# Patient Record
Sex: Female | Born: 1990 | Race: Black or African American | Hispanic: No | Marital: Single | Smoking: Former smoker
Health system: Southern US, Community
[De-identification: ages and names within clinical notes are randomized; demographics above are authoritative.]

## PROBLEM LIST (undated history)

## (undated) DIAGNOSIS — D649 Anemia, unspecified: Secondary | ICD-10-CM

## (undated) DIAGNOSIS — R011 Cardiac murmur, unspecified: Secondary | ICD-10-CM

## (undated) DIAGNOSIS — N760 Acute vaginitis: Secondary | ICD-10-CM

## (undated) DIAGNOSIS — B9689 Other specified bacterial agents as the cause of diseases classified elsewhere: Secondary | ICD-10-CM

## (undated) DIAGNOSIS — S32401A Unspecified fracture of right acetabulum, initial encounter for closed fracture: Secondary | ICD-10-CM

## (undated) HISTORY — PX: FRACTURE SURGERY: SHX138

---
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
## 2010-03-24 ENCOUNTER — Encounter: Payer: Self-pay | Admitting: *Deleted

## 2010-05-10 LAB — ABO/RH: ABO/RH(D): O POS

## 2010-05-10 LAB — CBC
Hemoglobin: 8.6 g/dL — ABNORMAL LOW (ref 12.0–15.0)
MCHC: 32.4 g/dL (ref 30.0–36.0)
MCV: 70.5 fL — ABNORMAL LOW (ref 78.0–100.0)
Platelets: 231 10*3/uL (ref 150–400)
WBC: 4.7 10*3/uL (ref 4.0–10.5)

## 2010-05-10 LAB — WET PREP, GENITAL
Clue Cells Wet Prep HPF POC: NONE SEEN
Trich, Wet Prep: NONE SEEN
Yeast Wet Prep HPF POC: NONE SEEN

## 2010-05-10 LAB — URINALYSIS, ROUTINE W REFLEX MICROSCOPIC
Glucose, UA: NEGATIVE mg/dL
Leukocytes, UA: NEGATIVE
Nitrite: NEGATIVE
Protein, ur: NEGATIVE mg/dL
Urobilinogen, UA: 0.2 mg/dL (ref 0.0–1.0)
pH: 6 (ref 5.0–8.0)

## 2010-05-10 LAB — HCG, QUANTITATIVE, PREGNANCY
hCG, Beta Chain, Quant, S: 2052 m[IU]/mL — ABNORMAL HIGH (ref <5)
hCG, Beta Chain, Quant, S: 5482 m[IU]/mL — ABNORMAL HIGH (ref <5)

## 2010-05-10 LAB — GC/CHLAMYDIA PROBE AMP, GENITAL
Chlamydia, DNA Probe: NEGATIVE
GC Probe Amp, Genital: NEGATIVE

## 2016-10-02 ENCOUNTER — Emergency Department (HOSPITAL_COMMUNITY)
Admission: EM | Admit: 2016-10-02 | Discharge: 2016-10-02 | Disposition: A | Discharge status: Home / Self Care | Payer: Self-pay | Attending: Emergency Medicine | Admitting: Emergency Medicine

## 2016-10-02 ENCOUNTER — Encounter (HOSPITAL_COMMUNITY): Payer: Self-pay | Admitting: Emergency Medicine

## 2016-10-02 DIAGNOSIS — D649 Anemia, unspecified: Secondary | ICD-10-CM | POA: Insufficient documentation

## 2016-10-02 DIAGNOSIS — R1030 Lower abdominal pain, unspecified: Secondary | ICD-10-CM | POA: Insufficient documentation

## 2016-10-02 DIAGNOSIS — B3731 Acute candidiasis of vulva and vagina: Secondary | ICD-10-CM

## 2016-10-02 DIAGNOSIS — N760 Acute vaginitis: Secondary | ICD-10-CM | POA: Insufficient documentation

## 2016-10-02 DIAGNOSIS — B373 Candidiasis of vulva and vagina: Secondary | ICD-10-CM

## 2016-10-02 HISTORY — DX: Other specified bacterial agents as the cause of diseases classified elsewhere: N76.0

## 2016-10-02 HISTORY — DX: Acute vaginitis: B96.89

## 2016-10-02 LAB — COMPREHENSIVE METABOLIC PANEL
ALK PHOS: 48 U/L (ref 38–126)
ALT: 15 U/L (ref 14–54)
AST: 30 U/L (ref 15–41)
Albumin: 4.1 g/dL (ref 3.5–5.0)
Anion gap: 9 (ref 5–15)
BILIRUBIN TOTAL: 0.7 mg/dL (ref 0.3–1.2)
BUN: 12 mg/dL (ref 6–20)
CALCIUM: 9.4 mg/dL (ref 8.9–10.3)
CO2: 22 mmol/L (ref 22–32)
CREATININE: 0.72 mg/dL (ref 0.44–1.00)
Chloride: 104 mmol/L (ref 101–111)
Glucose, Bld: 83 mg/dL (ref 65–99)
Potassium: 4.1 mmol/L (ref 3.5–5.1)
Sodium: 135 mmol/L (ref 135–145)
TOTAL PROTEIN: 7.5 g/dL (ref 6.5–8.1)

## 2016-10-02 LAB — CBC WITH DIFFERENTIAL/PLATELET
Basophils Absolute: 0 10*3/uL (ref 0.0–0.1)
Basophils Relative: 0 %
EOS PCT: 3 %
Eosinophils Absolute: 0.2 10*3/uL (ref 0.0–0.7)
HEMATOCRIT: 33.9 % — AB (ref 36.0–46.0)
HEMOGLOBIN: 10.8 g/dL — AB (ref 12.0–15.0)
LYMPHS ABS: 1.9 10*3/uL (ref 0.7–4.0)
LYMPHS PCT: 41 %
MCH: 22.8 pg — AB (ref 26.0–34.0)
MCHC: 31.9 g/dL (ref 30.0–36.0)
MCV: 71.5 fL — AB (ref 78.0–100.0)
Monocytes Absolute: 0.3 10*3/uL (ref 0.1–1.0)
Monocytes Relative: 7 %
NEUTROS ABS: 2.3 10*3/uL (ref 1.7–7.7)
Neutrophils Relative %: 49 %
PLATELETS: 231 10*3/uL (ref 150–400)
RBC: 4.74 MIL/uL (ref 3.87–5.11)
RDW: 19.4 % — ABNORMAL HIGH (ref 11.5–15.5)
WBC: 4.7 10*3/uL (ref 4.0–10.5)

## 2016-10-02 LAB — URINALYSIS, ROUTINE W REFLEX MICROSCOPIC
Bilirubin Urine: NEGATIVE
Glucose, UA: NEGATIVE mg/dL
HGB URINE DIPSTICK: NEGATIVE
Ketones, ur: NEGATIVE mg/dL
Leukocytes, UA: NEGATIVE
NITRITE: NEGATIVE
Protein, ur: NEGATIVE mg/dL
Specific Gravity, Urine: 1.014 (ref 1.005–1.030)
pH: 5 (ref 5.0–8.0)

## 2016-10-02 LAB — WET PREP, GENITAL
Clue Cells Wet Prep HPF POC: NONE SEEN
Sperm: NONE SEEN
Trich, Wet Prep: NONE SEEN
YEAST WET PREP: NONE SEEN

## 2016-10-02 LAB — POC URINE PREG, ED: Preg Test, Ur: NEGATIVE

## 2016-10-02 LAB — LIPASE, BLOOD: LIPASE: 29 U/L (ref 11–51)

## 2016-10-02 MED ORDER — AZITHROMYCIN 250 MG PO TABS
1000.0000 mg | ORAL_TABLET | Freq: Once | ORAL | Status: AC
  Administered 2016-10-02: 1000 mg via ORAL
  Filled 2016-10-02: qty 4

## 2016-10-02 MED ORDER — FLUCONAZOLE 150 MG PO TABS: 150.0000 mg | ORAL_TABLET | Freq: Once | ORAL | 0 refills | Status: AC

## 2016-10-02 MED ORDER — LIDOCAINE HCL (PF) 1 % IJ SOLN
INTRAMUSCULAR | Status: AC
  Administered 2016-10-02: 0.9 mL
  Filled 2016-10-02: qty 5

## 2016-10-02 MED ORDER — FLUCONAZOLE 100 MG PO TABS
150.0000 mg | ORAL_TABLET | Freq: Once | ORAL | Status: AC
  Administered 2016-10-02: 150 mg via ORAL
  Filled 2016-10-02: qty 2

## 2016-10-02 MED ORDER — CEFTRIAXONE SODIUM 250 MG IJ SOLR
250.0000 mg | Freq: Once | INTRAMUSCULAR | Status: AC
  Administered 2016-10-02: 250 mg via INTRAMUSCULAR
  Filled 2016-10-02: qty 250

## 2016-10-02 NOTE — Discharge Instructions (Signed)
Your work up today has been reassuring, your symptoms could be from a yeast infection; you were given the first dose of medication here for this, but if symptoms continue in 3 days then take the second dose of diflucan at that time. You have been treated for gonorrhea and chlamydia in the ER but the hospital will call you if lab is positive. You were tested for HIV and Syphilis, and the hospital will call you if the lab is positive. NO SEXUAL INTERCOURSE FOR AT LEAST 10 DAYS AFTER TODAY'S VISIT, THIS WILL INVALIDATE YOUR TREATMENT HERE. DO NOT ENGAGE IN SEXUAL ACTIVITY UNTIL YOU FIND OUT ABOUT YOUR RESULTS AND HAVE PARTNERS TESTED AND TREATED. ALL PARTNERS MUST BE TESTED AND TREATED FOR STD'S. ALWAYS USE CONDOMS WHEN ENGAGING IN INTERCOURSE. [facility]D concerns or screenings.  Alternate between tylenol and motrin as needed for pain. Follow up with your regular doctor in 1 week for recheck of symptoms. [facility] (called [facility]) for any changes or worsening symptoms.

## 2016-10-02 NOTE — ED Triage Notes (Signed)
Pt sts some lower abd pain and vaginal discharge; pt sts LMP was 1 week ago

## 2016-10-02 NOTE — ED Provider Notes (Signed)
[facility]-EMERGENCY DEPT Provider Note   CSN: [id number] Arrival date & time: 10/02/16  0849     History   Chief Complaint Chief Complaint  Patient presents with  . Vaginal Discharge    HPI Megan Mosley is a 26 y.o. [id number] female with a PMHx of eczema and recurrent BV, who presents to the ED with complaints of intermittent lower abdominal pain 1 month that has been worse over the last 1 week, as well as thick white vaginal discharge that she states feels the same as when she has had BV in the past. She states that she has been seeing the health department for vaginal discharge frequently, is always diagnosed with BV, most recently was last month. This always resolves after taking Flagyl. She states however that she doesn't usually get abdominal pain with her BV so she wanted to be evaluated today. She describes her pain is 7/10 intermittent "pinching" nonradiating lower abdominal pain, with no known aggravating factors, and mildly improved with ibuprofen. She reports associated vaginal itching. She recently started using [place] however denies any other new products to the vaginal area or any new condoms/other exposures. She has been sexually active with 2 female partners in the last year, sometimes unprotected. She stopped taking her OCPs one week ago. She admits to fairly regular NSAID use. She has had 4 elective abortions, but denies any other prior abd surgeries otherwise. She denies any fevers, chills, CP, SOB, n/v/d/c, obstipation, dysuria, hematuria, urine frequency, vaginal bleeding, genital sores, myalgias, arthralgias, numbness, tingling, focal weakness, or any other complaints at this time. Denies recent travel, sick contacts, suspicious food intake, frequent EtOH use, or recent abx.    The history is provided by the patient and medical records. No language interpreter was used.  Vaginal Discharge   Associated symptoms include abdominal pain. Pertinent negatives include no fever, no  constipation, no diarrhea, no nausea, no vomiting, no dysuria and no frequency.  Abdominal Pain   This is a new problem. The current episode started more than 1 week ago. Episode frequency: intermittent. The problem has not changed since onset.The pain is associated with an unknown factor. The pain is located in the LLQ, RLQ and suprapubic region. Quality: pinching. The pain is at a severity of 7/10. The pain is moderate. Pertinent negatives include fever, diarrhea, flatus, nausea, vomiting, constipation, dysuria, frequency, hematuria, arthralgias and myalgias. Nothing aggravates the symptoms. The symptoms are relieved by NSAIDs.    Past Medical History:  Diagnosis Date  . BV (bacterial vaginosis)     Patient Active Problem List   Diagnosis Date Noted  . ACNE 12/25/2007  . VISUAL DISTURBANCE NOS 04/19/2006  . ECZEMA, ATOPIC DERMATITIS 04/19/2006  . HEADACHE, UNSPECIFIED 04/19/2006    History reviewed. No pertinent surgical history.  OB History    No data available       Home Medications    Prior to Admission medications   Medication Sig Start Date End Date Taking? Authorizing Provider  Benzoyl Peroxide-Erythromycin 5-3 % PACK Apply topically. Apply two times a day to prevent acne     [provider]    Family History History reviewed. No pertinent family history.  Social History Social History  Substance Use Topics  . Smoking status: Never Smoker  . Smokeless tobacco: Never Used  . Alcohol use No     Allergies   Patient has no known allergies.   Review of Systems Review of Systems  Constitutional: Negative for chills and fever.  Respiratory:  Negative for shortness of breath.   Cardiovascular: Negative for chest pain.  Gastrointestinal: Positive for abdominal pain. Negative for constipation, diarrhea, flatus, nausea and vomiting.  Genitourinary: Positive for vaginal discharge and vaginal pain (itching). Negative for dysuria, frequency, genital sores,  hematuria and vaginal bleeding.  Musculoskeletal: Negative for arthralgias and myalgias.  Skin: Negative for color change.  Allergic/Immunologic: Negative for immunocompromised state.  Neurological: Negative for weakness and numbness.  Psychiatric/Behavioral: Negative for confusion.   All other systems reviewed and are negative for acute change except as noted in the HPI.    Physical Exam Updated Vital Signs BP 128/80 (BP Location: Left Arm)   Pulse 69   Temp 98.6 F (37 C) (Oral)   Resp 17   Ht 5\' 3"  (1.6 m)   Wt 57.6 kg (127 lb)   SpO2 100%   BMI 22.50 kg/m   Physical Exam  Constitutional: She is oriented to person, place, and time. Vital signs are normal. She appears well-developed and well-nourished.  Non-toxic appearance. No distress.  Afebrile, nontoxic, NAD  HENT:  Head: Normocephalic and atraumatic.  Mouth/Throat: Oropharynx is clear and moist and mucous membranes are normal.  Eyes: Conjunctivae and EOM are normal. Right eye exhibits no discharge. Left eye exhibits no discharge.  Neck: Normal range of motion. Neck supple.  Cardiovascular: Normal rate, regular rhythm, normal heart sounds and intact distal pulses.  Exam reveals no gallop and no friction rub.   No murmur heard. Pulmonary/Chest: Effort normal and breath sounds normal. No respiratory distress. She has no decreased breath sounds. She has no wheezes. She has no rhonchi. She has no rales.  Abdominal: Soft. Normal appearance and bowel sounds are normal. She exhibits no distension. There is tenderness in the suprapubic area and left lower quadrant. There is no rigidity, no rebound, no guarding, no CVA tenderness, no tenderness at McBurney's point and negative Murphy's sign.  Soft, nondistended, +BS throughout, with mild LLQ and suprapubic TTP, no r/g/r, neg murphy's, neg mcburney's, no CVA TTP   Genitourinary: Uterus normal. Pelvic exam was performed with patient supine. There is no rash, tenderness or lesion on the  right labia. There is no rash, tenderness or lesion on the left labia. Cervix exhibits no motion tenderness, no discharge and no friability. Right adnexum displays no mass, no tenderness and no fullness. Left adnexum displays no mass, no tenderness and no fullness. No erythema, tenderness or bleeding in the vagina. Vaginal discharge found.  Genitourinary Comments: Chaperone present for exam. No rashes, lesions, or tenderness to external genitalia. No erythema, injury, or tenderness to vaginal mucosa. Mild amount of thick white vaginal discharge adhered to vaginal vault walls, no vaginal bleeding within vaginal vault. No adnexal masses, tenderness, or fullness. No CMT, cervical friability, or discharge from cervical os. Cervical os is closed. Uterus non-deviated, mobile, nonTTP, and without enlargement.    Musculoskeletal: Normal range of motion.  Neurological: She is alert and oriented to person, place, and time. She has normal strength. No sensory deficit.  Skin: Skin is warm, dry and intact. No rash noted.  Psychiatric: She has a normal mood and affect.  Nursing note and vitals reviewed.    ED Treatments / Results  Labs (all labs ordered are listed, but only abnormal results are displayed) Labs Reviewed  WET PREP, GENITAL - Abnormal; Notable for the following:       Result Value   WBC, Wet Prep HPF POC MODERATE (*)    All other components within normal limits  URINALYSIS, ROUTINE W REFLEX MICROSCOPIC - Abnormal; Notable for the following:    APPearance HAZY (*)    All other components within normal limits  CBC WITH DIFFERENTIAL/PLATELET - Abnormal; Notable for the following:    Hemoglobin 10.8 (*)    HCT 33.9 (*)    MCV 71.5 (*)    MCH 22.8 (*)    RDW 19.4 (*)    All other components within normal limits  COMPREHENSIVE METABOLIC PANEL  LIPASE, BLOOD  RPR  HIV ANTIBODY (ROUTINE TESTING)  POC URINE PREG, ED  GC/CHLAMYDIA PROBE AMP ([place]) NOT AT [facility]    EKG  EKG  Interpretation None       Radiology No results found.  Procedures Procedures (including critical care time)  Medications Ordered in ED Medications  azithromycin (ZITHROMAX) tablet 1,000 mg (not administered)    And  cefTRIAXone (ROCEPHIN) injection 250 mg (not administered)  fluconazole (DIFLUCAN) tablet 150 mg (not administered)  lidocaine (PF) (XYLOCAINE) 1 % injection (not administered)     Initial Impression / Assessment and Plan / ED Course  I have reviewed the triage vital signs and the nursing notes.  Pertinent labs & imaging results that were available during my care of the patient were reviewed by me and considered in my medical decision making (see chart for details).     26 y.o. female here with lower abd pain x1 month worse x1 wk, and vaginal discharge. States it feels like prior BV, but she doesn't usually have abd pain with that. On exam, mild LLQ/suprapubic TTP, nonperitoneal; will proceed with pelvic exam in order to further evaluate. Upreg neg, will get labs and U/A in addition to pelvic exam. Will reassess after that and decide on any further imaging/labs that would be necessary. Pt denies wanting anything for pain. Will reassess shortly.   1:14 PM Pelvic exam reveals thick white discharge adhered to walls of vaginal vault, no cervical discharge, no CMT, no adnexal masses or tenderness. Awaiting labs, doubt need for imaging at this point, will reassess after labs/wet prep result. Pt continues to deny wanting anything for symptoms at this time. Will reassess shortly.    3:26 PM CBC w/diff with chronic anemia actually improved from prior values. CMP WNL. Lipase WNL. U/A unremarkable. Wet prep with moderate WBCs, no yeast seen however over diluted specimen so could be altered results; clinically it looks like yeast, so will empirically treat for this, and send home with diflucan for 3 days later if symptoms persist. Will also empirically treat for STDs, given the  WBCs on wet prep. Discussed abstinence x10 days. F/up with health dept for future STD concerns. Safe sex encouraged, and discussed having partners tested and treated before re-engaging in intercourse after the 10 day abstinence period. Abd pain could be ovulatory pain since she just came off OCPs, but hard to say; doubt acute emergent pathology requiring further work up today. Advised tylenol/motrin for pain, advised f/up with PCP in 1wk for recheck of symptoms. I explained the diagnosis and have given explicit precautions to return to the ER including for any other new or worsening symptoms. The patient understands and accepts the medical plan as it's been dictated and I have answered their questions. Discharge instructions concerning home care and prescriptions have been given. The patient is STABLE and is discharged to home in good condition.    Final Clinical Impressions(s) / ED Diagnoses   Final diagnoses:  Lower abdominal pain  Yeast vaginitis  Chronic anemia  New Prescriptions New Prescriptions   FLUCONAZOLE (DIFLUCAN) 150 MG TABLET    Take 1 tablet (150 mg total) by mouth once. TAKE ON 10/05/16 IF SYMPTOMS [address], [place], [place] 10/02/16 1527    Shaune Pollack, MD 10/03/16 [phone]

## 2016-10-03 LAB — RPR: RPR Ser Ql: NONREACTIVE

## 2016-10-03 LAB — HIV ANTIBODY (ROUTINE TESTING W REFLEX): HIV Screen 4th Generation wRfx: NONREACTIVE

## 2016-10-04 LAB — GC/CHLAMYDIA PROBE AMP (~~LOC~~) NOT AT ARMC
Chlamydia: NEGATIVE
Neisseria Gonorrhea: NEGATIVE

## 2017-08-21 ENCOUNTER — Emergency Department (HOSPITAL_COMMUNITY): Payer: BLUE CROSS/BLUE SHIELD

## 2017-08-21 ENCOUNTER — Inpatient Hospital Stay (HOSPITAL_COMMUNITY): Payer: BLUE CROSS/BLUE SHIELD

## 2017-08-21 ENCOUNTER — Inpatient Hospital Stay (HOSPITAL_COMMUNITY)
Admission: EM | Admit: 2017-08-21 | Discharge: 2017-08-25 | DRG: 516 | Disposition: A | Discharge status: Home Health | Payer: BLUE CROSS/BLUE SHIELD | Attending: General Surgery | Admitting: General Surgery

## 2017-08-21 ENCOUNTER — Encounter (HOSPITAL_COMMUNITY): Payer: Self-pay | Admitting: Emergency Medicine

## 2017-08-21 ENCOUNTER — Other Ambulatory Visit: Payer: Self-pay

## 2017-08-21 DIAGNOSIS — S329XXA Fracture of unspecified parts of lumbosacral spine and pelvis, initial encounter for closed fracture: Secondary | ICD-10-CM

## 2017-08-21 DIAGNOSIS — S32401A Unspecified fracture of right acetabulum, initial encounter for closed fracture: Secondary | ICD-10-CM | POA: Diagnosis present

## 2017-08-21 DIAGNOSIS — D62 Acute posthemorrhagic anemia: Secondary | ICD-10-CM | POA: Diagnosis not present

## 2017-08-21 DIAGNOSIS — R402242 Coma scale, best verbal response, confused conversation, at arrival to emergency department: Secondary | ICD-10-CM | POA: Diagnosis present

## 2017-08-21 DIAGNOSIS — Z419 Encounter for procedure for purposes other than remedying health state, unspecified: Secondary | ICD-10-CM

## 2017-08-21 DIAGNOSIS — S0081XA Abrasion of other part of head, initial encounter: Secondary | ICD-10-CM | POA: Diagnosis present

## 2017-08-21 DIAGNOSIS — K5900 Constipation, unspecified: Secondary | ICD-10-CM | POA: Diagnosis not present

## 2017-08-21 DIAGNOSIS — S332XXA Dislocation of sacroiliac and sacrococcygeal joint, initial encounter: Secondary | ICD-10-CM | POA: Diagnosis present

## 2017-08-21 DIAGNOSIS — S32461A Displaced associated transverse-posterior fracture of right acetabulum, initial encounter for closed fracture: Secondary | ICD-10-CM | POA: Diagnosis present

## 2017-08-21 DIAGNOSIS — S32421A Displaced fracture of posterior wall of right acetabulum, initial encounter for closed fracture: Secondary | ICD-10-CM

## 2017-08-21 DIAGNOSIS — Y908 Blood alcohol level of 240 mg/100 ml or more: Secondary | ICD-10-CM | POA: Diagnosis present

## 2017-08-21 DIAGNOSIS — R402362 Coma scale, best motor response, obeys commands, at arrival to emergency department: Secondary | ICD-10-CM | POA: Diagnosis present

## 2017-08-21 DIAGNOSIS — F10129 Alcohol abuse with intoxication, unspecified: Secondary | ICD-10-CM | POA: Diagnosis present

## 2017-08-21 DIAGNOSIS — S73014A Posterior dislocation of right hip, initial encounter: Secondary | ICD-10-CM

## 2017-08-21 DIAGNOSIS — R52 Pain, unspecified: Secondary | ICD-10-CM

## 2017-08-21 DIAGNOSIS — R410 Disorientation, unspecified: Secondary | ICD-10-CM | POA: Diagnosis present

## 2017-08-21 DIAGNOSIS — R402142 Coma scale, eyes open, spontaneous, at arrival to emergency department: Secondary | ICD-10-CM | POA: Diagnosis present

## 2017-08-21 DIAGNOSIS — F10929 Alcohol use, unspecified with intoxication, unspecified: Secondary | ICD-10-CM | POA: Diagnosis present

## 2017-08-21 DIAGNOSIS — T148XXA Other injury of unspecified body region, initial encounter: Secondary | ICD-10-CM

## 2017-08-21 HISTORY — DX: Anemia, unspecified: D64.9

## 2017-08-21 HISTORY — PX: CLOSED REDUCTION HIP DISLOCATION: SUR221

## 2017-08-21 HISTORY — DX: Unspecified fracture of right acetabulum, initial encounter for closed fracture: S32.401A

## 2017-08-21 HISTORY — DX: Cardiac murmur, unspecified: R01.1

## 2017-08-21 LAB — URINALYSIS, ROUTINE W REFLEX MICROSCOPIC
Bilirubin Urine: NEGATIVE
Glucose, UA: NEGATIVE mg/dL
Ketones, ur: NEGATIVE mg/dL
Leukocytes, UA: NEGATIVE
Nitrite: NEGATIVE
Protein, ur: NEGATIVE mg/dL
SPECIFIC GRAVITY, URINE: 1.017 (ref 1.005–1.030)
pH: 6 (ref 5.0–8.0)

## 2017-08-21 LAB — COMPREHENSIVE METABOLIC PANEL
ALT: 38 U/L (ref 0–44)
AST: 68 U/L — AB (ref 15–41)
Albumin: 3.9 g/dL (ref 3.5–5.0)
Alkaline Phosphatase: 53 U/L (ref 38–126)
Anion gap: 12 (ref 5–15)
BUN: 9 mg/dL (ref 6–20)
CHLORIDE: 110 mmol/L (ref 98–111)
CO2: 19 mmol/L — AB (ref 22–32)
CREATININE: 0.74 mg/dL (ref 0.44–1.00)
Calcium: 9.2 mg/dL (ref 8.9–10.3)
GFR calc Af Amer: >60 mL/min (ref >60)
GFR calc non Af Amer: >60 mL/min (ref >60)
Glucose, Bld: 137 mg/dL — ABNORMAL HIGH (ref 70–99)
Potassium: 3.3 mmol/L — ABNORMAL LOW (ref 3.5–5.1)
SODIUM: 141 mmol/L (ref 135–145)
Total Bilirubin: 0.4 mg/dL (ref 0.3–1.2)
Total Protein: 7.4 g/dL (ref 6.5–8.1)

## 2017-08-21 LAB — CBC WITH DIFFERENTIAL/PLATELET
ABS IMMATURE GRANULOCYTES: 0 10*3/uL (ref 0.0–0.1)
BASOS PCT: 1 %
Basophils Absolute: 0 10*3/uL (ref 0.0–0.1)
EOS ABS: 0.1 10*3/uL (ref 0.0–0.7)
Eosinophils Relative: 1 %
HCT: 33.5 % — ABNORMAL LOW (ref 36.0–46.0)
Hemoglobin: 10.2 g/dL — ABNORMAL LOW (ref 12.0–15.0)
Immature Granulocytes: 0 %
Lymphocytes Relative: 46 %
Lymphs Abs: 3 10*3/uL (ref 0.7–4.0)
MCH: 23.7 pg — AB (ref 26.0–34.0)
MCHC: 30.4 g/dL (ref 30.0–36.0)
MCV: 77.7 fL — AB (ref 78.0–100.0)
MONO ABS: 0.4 10*3/uL (ref 0.1–1.0)
MONOS PCT: 6 %
NEUTROS ABS: 3.1 10*3/uL (ref 1.7–7.7)
Neutrophils Relative %: 46 %
PLATELETS: 330 10*3/uL (ref 150–400)
RBC: 4.31 MIL/uL (ref 3.87–5.11)
RDW: 16.2 % — ABNORMAL HIGH (ref 11.5–15.5)
WBC: 6.7 10*3/uL (ref 4.0–10.5)

## 2017-08-21 LAB — SAMPLE TO BLOOD BANK

## 2017-08-21 LAB — POC URINE PREG, ED: Preg Test, Ur: NEGATIVE

## 2017-08-21 LAB — RAPID URINE DRUG SCREEN, HOSP PERFORMED
Amphetamines: NOT DETECTED
Benzodiazepines: NOT DETECTED
Cocaine: NOT DETECTED
Opiates: POSITIVE — AB
TETRAHYDROCANNABINOL: NOT DETECTED

## 2017-08-21 LAB — I-STAT CHEM 8, ED
BUN: 10 mg/dL (ref 6–20)
CHLORIDE: 110 mmol/L (ref 98–111)
CREATININE: 0.9 mg/dL (ref 0.44–1.00)
Calcium, Ion: 1.16 mmol/L (ref 1.15–1.40)
Glucose, Bld: 135 mg/dL — ABNORMAL HIGH (ref 70–99)
HEMATOCRIT: 33 % — AB (ref 36.0–46.0)
HEMOGLOBIN: 11.2 g/dL — AB (ref 12.0–15.0)
POTASSIUM: 3.4 mmol/L — AB (ref 3.5–5.1)
Sodium: 144 mmol/L (ref 135–145)
TCO2: 18 mmol/L — ABNORMAL LOW (ref 22–32)

## 2017-08-21 LAB — CBC
HCT: 25 % — ABNORMAL LOW (ref 36.0–46.0)
Hemoglobin: 7.7 g/dL — ABNORMAL LOW (ref 12.0–15.0)
MCH: 24.1 pg — ABNORMAL LOW (ref 26.0–34.0)
MCHC: 30.8 g/dL (ref 30.0–36.0)
MCV: 78.1 fL (ref 78.0–100.0)
PLATELETS: 175 10*3/uL (ref 150–400)
RBC: 3.2 MIL/uL — AB (ref 3.87–5.11)
RDW: 16.3 % — ABNORMAL HIGH (ref 11.5–15.5)
WBC: 8.8 10*3/uL (ref 4.0–10.5)

## 2017-08-21 LAB — ETHANOL: Alcohol, Ethyl (B): 245 mg/dL — ABNORMAL HIGH (ref <10)

## 2017-08-21 LAB — LACTIC ACID, PLASMA
LACTIC ACID, VENOUS: 2.4 mmol/L — AB (ref 0.5–1.9)
Lactic Acid, Venous: 4.3 mmol/L (ref 0.5–1.9)
Lactic Acid, Venous: 3.4 mmol/L (ref 0.5–1.9)

## 2017-08-21 LAB — I-STAT BETA HCG BLOOD, ED (MC, WL, AP ONLY): I-stat hCG, quantitative: <5 m[IU]/mL (ref <5)

## 2017-08-21 LAB — I-STAT CG4 LACTIC ACID, ED: LACTIC ACID, VENOUS: 4.55 mmol/L — AB (ref 0.5–1.9)

## 2017-08-21 LAB — CDS SEROLOGY

## 2017-08-21 LAB — PREPARE RBC (CROSSMATCH)

## 2017-08-21 LAB — PROTIME-INR
INR: 0.99
Prothrombin Time: 13 seconds (ref 11.4–15.2)

## 2017-08-21 LAB — ABO/RH: ABO/RH(D): O POS

## 2017-08-21 LAB — SURGICAL PCR SCREEN
MRSA, PCR: NEGATIVE
Staphylococcus aureus: NEGATIVE

## 2017-08-21 MED ORDER — IOHEXOL 300 MG/ML  SOLN
100.0000 mL | Freq: Once | INTRAMUSCULAR | Status: AC | PRN
  Administered 2017-08-21: 100 mL via INTRAVENOUS

## 2017-08-21 MED ORDER — HYDROMORPHONE HCL 1 MG/ML IJ SOLN
INTRAMUSCULAR | Status: AC
  Administered 2017-08-21: 1 mg
  Filled 2017-08-21: qty 1

## 2017-08-21 MED ORDER — ALBUMIN HUMAN 5 % IV SOLN
25.0000 g | Freq: Once | INTRAVENOUS | Status: AC
  Administered 2017-08-21: 25 g via INTRAVENOUS
  Filled 2017-08-21 (×2): qty 500

## 2017-08-21 MED ORDER — SODIUM CHLORIDE 0.9 % IV BOLUS
1000.0000 mL | Freq: Once | INTRAVENOUS | Status: AC
  Administered 2017-08-21: 1000 mL via INTRAVENOUS

## 2017-08-21 MED ORDER — TETANUS-DIPHTH-ACELL PERTUSSIS 5-2.5-18.5 LF-MCG/0.5 IM SUSP
0.5000 mL | Freq: Once | INTRAMUSCULAR | Status: AC
Start: 2017-08-21 — End: 2017-08-21
  Administered 2017-08-21: 0.5 mL via INTRAMUSCULAR

## 2017-08-21 MED ORDER — ONDANSETRON HCL 4 MG/2ML IJ SOLN: 4.0000 mg | Freq: Four times a day (QID) | INTRAMUSCULAR | Status: DC | PRN

## 2017-08-21 MED ORDER — AMMONIA AROMATIC IN INHA
RESPIRATORY_TRACT | Status: AC
  Filled 2017-08-21: qty 10

## 2017-08-21 MED ORDER — SODIUM CHLORIDE 0.9% IV SOLUTION: Freq: Once | INTRAVENOUS | Status: DC

## 2017-08-21 MED ORDER — BACITRACIN ZINC 500 UNIT/GM EX OINT
TOPICAL_OINTMENT | Freq: Two times a day (BID) | CUTANEOUS | Status: DC
  Administered 2017-08-21 – 2017-08-24 (×7): via TOPICAL
  Administered 2017-08-25: 1 via TOPICAL
  Filled 2017-08-21: qty 28.4
  Filled 2017-08-21 (×2): qty 28.35

## 2017-08-21 MED ORDER — MORPHINE SULFATE (PF) 4 MG/ML IV SOLN
4.0000 mg | Freq: Once | INTRAVENOUS | Status: AC
  Administered 2017-08-21: 4 mg via INTRAVENOUS

## 2017-08-21 MED ORDER — ONDANSETRON HCL 4 MG/2ML IJ SOLN
4.0000 mg | Freq: Once | INTRAMUSCULAR | Status: AC
  Administered 2017-08-21: 4 mg via INTRAVENOUS
  Filled 2017-08-21: qty 2

## 2017-08-21 MED ORDER — OXYCODONE HCL 5 MG PO TABS
5.0000 mg | ORAL_TABLET | ORAL | Status: DC | PRN
  Administered 2017-08-25: 5 mg via ORAL
  Filled 2017-08-21: qty 1

## 2017-08-21 MED ORDER — KCL IN DEXTROSE-NACL 20-5-0.45 MEQ/L-%-% IV SOLN
INTRAVENOUS | Status: DC
  Administered 2017-08-21 – 2017-08-25 (×7): via INTRAVENOUS
  Filled 2017-08-21 (×7): qty 1000

## 2017-08-21 MED ORDER — HYDRALAZINE HCL 20 MG/ML IJ SOLN: 10.0000 mg | INTRAMUSCULAR | Status: DC | PRN

## 2017-08-21 MED ORDER — HYDROMORPHONE HCL 1 MG/ML IJ SOLN
1.0000 mg | INTRAMUSCULAR | Status: DC | PRN
  Administered 2017-08-21 – 2017-08-22 (×4): 1 mg via INTRAVENOUS
  Filled 2017-08-21 (×5): qty 1

## 2017-08-21 MED ORDER — CEFAZOLIN SODIUM-DEXTROSE 2-4 GM/100ML-% IV SOLN
2.0000 g | INTRAVENOUS | Status: AC
  Administered 2017-08-22: 2 g via INTRAVENOUS
  Filled 2017-08-21: qty 100

## 2017-08-21 MED ORDER — ACETAMINOPHEN 325 MG PO TABS
650.0000 mg | ORAL_TABLET | ORAL | Status: DC | PRN
  Administered 2017-08-23 – 2017-08-25 (×5): 650 mg via ORAL
  Filled 2017-08-21 (×6): qty 2

## 2017-08-21 MED ORDER — TETANUS-DIPHTH-ACELL PERTUSSIS 5-2.5-18.5 LF-MCG/0.5 IM SUSP
INTRAMUSCULAR | Status: AC
  Filled 2017-08-21: qty 0.5

## 2017-08-21 MED ORDER — METHOCARBAMOL 1000 MG/10ML IJ SOLN
1000.0000 mg | Freq: Three times a day (TID) | INTRAVENOUS | Status: DC | PRN
  Administered 2017-08-21: 1000 mg via INTRAVENOUS
  Filled 2017-08-21 (×2): qty 10

## 2017-08-21 MED ORDER — ONDANSETRON 4 MG PO TBDP: 4.0000 mg | ORAL_TABLET | Freq: Four times a day (QID) | ORAL | Status: DC | PRN

## 2017-08-21 MED ORDER — PROPOFOL 10 MG/ML IV BOLUS
1.0000 mg/kg | INTRAVENOUS | Status: DC | PRN
  Administered 2017-08-21: 54.4 mg via INTRAVENOUS
  Filled 2017-08-21: qty 20

## 2017-08-21 MED ORDER — ONDANSETRON HCL 4 MG/2ML IJ SOLN
4.0000 mg | Freq: Once | INTRAMUSCULAR | Status: AC
  Administered 2017-08-21: 4 mg via INTRAVENOUS

## 2017-08-21 MED ORDER — MORPHINE SULFATE (PF) 4 MG/ML IV SOLN
INTRAVENOUS | Status: AC
  Filled 2017-08-21: qty 1

## 2017-08-21 MED ORDER — OXYCODONE HCL 5 MG PO TABS
10.0000 mg | ORAL_TABLET | ORAL | Status: DC | PRN
  Administered 2017-08-21 – 2017-08-24 (×13): 10 mg via ORAL
  Filled 2017-08-21 (×12): qty 2
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
## 2017-08-21 NOTE — ED Notes (Signed)
Post cath peri care done
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
## 2017-08-21 NOTE — ED Notes (Signed)
Radiology at bedside

## 2017-08-21 NOTE — ED Notes (Signed)
Lab called reported critical value Lactic Acid 4.3. Notified Dr Lynelle DoctorKnapp.

## 2017-08-21 NOTE — ED Provider Notes (Signed)
.  Sedation Date/Time: 08/21/2017 9:08 AM Performed by: Linwood DibblesKnapp, Lacoya Wilbanks, MD Authorized by: Linwood DibblesKnapp, Jabarie Pop, MD   Consent:    Consent obtained:  Written   Consent given by:  Patient and parent   Risks discussed:  Allergic reaction, dysrhythmia, inadequate sedation, nausea, prolonged hypoxia resulting in organ damage, prolonged sedation necessitating reversal, respiratory compromise necessitating ventilatory assistance and intubation and vomiting   Alternatives discussed:  Analgesia without sedation, anxiolysis and regional anesthesia Universal protocol:    Procedure explained and questions answered to patient or proxy's satisfaction: yes     Relevant documents present and verified: yes     Test results available and properly labeled: yes     Imaging studies available: yes     Required blood products, implants, devices, and special equipment available: yes     Site/side marked: yes     Immediately prior to procedure a time out was called: yes     Patient identity confirmation method:  Verbally with patient Indications:    Procedure performed:  Dislocation reduction   Procedure necessitating sedation performed by:  Physician performing sedation   Intended level of sedation:  Deep Pre-sedation assessment:    Time since last food or drink:  10 hours   ASA classification: class 1 - normal, healthy patient     Neck mobility: normal     Mouth opening:  3 or more finger widths   Thyromental distance:  4 finger widths   Mallampati score:  I - soft palate, uvula, fauces, pillars visible   Pre-sedation assessments completed and reviewed: airway patency, cardiovascular function, hydration status, mental status, nausea/vomiting, pain level, respiratory function and temperature     Pre-sedation assessment completed:  08/21/2017 8:39 AM Immediate pre-procedure details:    Reassessment: Patient reassessed immediately prior to procedure     Reviewed: vital signs, relevant labs/tests and NPO status     Verified: bag  valve mask available, emergency equipment available, intubation equipment available, IV patency confirmed, oxygen available and suction available   Procedure details (see MAR for exact dosages):    Preoxygenation:  Nasal cannula   Sedation:  Propofol   Intra-procedure monitoring:  Blood pressure monitoring, cardiac monitor, continuous pulse oximetry, frequent LOC assessments, frequent vital sign checks and continuous capnometry   Intra-procedure events: respiratory depression     Intra-procedure management:  BVM ventilation   Total Provider sedation time (minutes):  30 Post-procedure details:    Post-sedation assessment completed:  08/21/2017 9:09 AM   Attendance: Constant attendance by certified staff until patient recovered     Recovery: Patient returned to pre-procedure baseline     Post-sedation assessments completed and reviewed: airway patency, cardiovascular function, hydration status, mental status, nausea/vomiting, pain level, respiratory function and temperature     Patient is stable for discharge or admission: yes     Patient tolerance:  Tolerated well, no immediate complications Comments:     Apnea noted on CO2 monitor.  Pt ventilated with bag valve mask.  No episodes of hypoxia.  Vitals remained stable.   Bag valve mask until spontaneous respirations returned.      Linwood DibblesKnapp, Aliea Bobe, MD 08/21/17 [phone]

## 2017-08-21 NOTE — ED Notes (Signed)
Brother and sister at bedside.

## 2017-08-21 NOTE — ED Notes (Signed)
Sp[facility] w/ [facility]iana [facility] - ordered [facility] hospital bed.

## 2017-08-21 NOTE — Progress Notes (Signed)
MD made aware of the lactic acid result

## 2017-08-21 NOTE — Plan of Care (Signed)
  Problem: Pain Managment: Goal: General experience of comfort will improve Outcome: Progressing   Problem: Skin Integrity: Goal: Risk for impaired skin integrity will decrease Outcome: Progressing   
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
## 2017-08-21 NOTE — ED Notes (Signed)
Called CT regarding CT pelvis. Will be sending for patient.

## 2017-08-21 NOTE — Sedation Documentation (Signed)
Arrived at bedside while radiology at bedside taking portable xrays. Mother and father at bedside.

## 2017-08-21 NOTE — ED Notes (Signed)
Bacitracin applied to pt forehead.

## 2017-08-21 NOTE — ED Notes (Signed)
Spoke with OR and confirmed surgery tomorrow, Spoke with [employer]omatic Services operational officersecretary and ordered a hospital bed while waiting for bed assignment per bed placement.
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
## 2017-08-21 NOTE — Procedures (Signed)
Clinicians: Doralee AlbinoMichael H. Carola FrostHandy, MD Mearl LatinKeith W. Vi Whitesel, PA-C  Procedure: R distal femoral traction pin placement for Right acetabulum fracture dislocation. Status post closed reduction.   Medications: Conscious sedation with propofol under EDP supervision  Details:  Injury and treatment were reviewed with the patient. .  Clinical exam was completed.  Closed reduction of the right acetabulum fracture dislocation was performed.   Following reduction of her right hip, her right thigh and knee were propped up on 3 blankets to give us knee flexion. Area in line with the superior pole of the patella was identified both medially and laterally along the distal femur. An area was identified in the midportion of the distal femur in line with this area. The areas were cleaned with Betadine swabs 3.   A 2.0 mm smooth K wire was selected.  With the patient still sedated from her hip reduction the pin was inserted and placed down to the bone. Once I was comfortable with the starting point the pin was advanced utilizing power drill through the medial distal femur and out the lateral aspect of the distal femur.   After the pin was advanced through and equal lengths of the pin were noted medially and laterally the tension bow was applied and 30 pounds of weight was added.  Patient's motor and sensory functions were intact post procedure.    Repeat CT scan of her pelvis will be performed to fully characterize a fracture, follow-up x-rays of her pelvis and acetabulum were performed as well to confirm reduction.  Patient will likely be taken to the OR tomorrow for ORIF of complex acetabulum fracture so long as she is adequately resuscitated   Mearl LatinKeith W. Tsutomu Barfoot, PA-C [facility] [phone] (P)

## 2017-08-21 NOTE — ED Notes (Signed)
Returned from CT. Right pedal pulse +2.

## 2017-08-21 NOTE — ED Notes (Signed)
All jewelry and keys given to Mother

## 2017-08-21 NOTE — Sedation Documentation (Signed)
Given warm blanket to patient and increased room temperature.
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
## 2017-08-21 NOTE — Progress Notes (Signed)
RT was available standby during conscious sedation. No complications. RT will continue to monitor.

## 2017-08-21 NOTE — Progress Notes (Signed)
Orthopedic Tech Progress Note Patient Details:  Megan Mosley 1991/01/12 [id number]  Musculoskeletal Traction Type of Traction: Skeletal (Balanced Suspension) Traction Location: rle Traction Weight: 30 lbs   Post Interventions Patient Tolerated: Well Instructions Provided: Care of device   Nikki DomCrawford, [name] 08/21/2017, 12:53 PM

## 2017-08-21 NOTE — ED Notes (Signed)
MD and family at bedside.
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
## 2017-08-22 ENCOUNTER — Inpatient Hospital Stay (HOSPITAL_COMMUNITY): Payer: BLUE CROSS/BLUE SHIELD

## 2017-08-22 ENCOUNTER — Inpatient Hospital Stay (HOSPITAL_COMMUNITY): Payer: BLUE CROSS/BLUE SHIELD | Admitting: Certified Registered Nurse Anesthetist

## 2017-08-22 ENCOUNTER — Encounter (HOSPITAL_COMMUNITY): Admission: EM | Disposition: A | Discharge status: Home Health | Payer: Self-pay | Source: Home / Self Care

## 2017-08-22 ENCOUNTER — Encounter (HOSPITAL_COMMUNITY): Payer: Self-pay | Admitting: *Deleted

## 2017-08-22 HISTORY — PX: ORIF ACETABULAR FRACTURE: SHX5029

## 2017-08-22 LAB — COMPREHENSIVE METABOLIC PANEL
ALT: 30 U/L (ref 0–44)
AST: 70 U/L — ABNORMAL HIGH (ref 15–41)
Albumin: 3.3 g/dL — ABNORMAL LOW (ref 3.5–5.0)
Alkaline Phosphatase: 42 U/L (ref 38–126)
Anion gap: 4 — ABNORMAL LOW (ref 5–15)
BUN: <5 mg/dL — ABNORMAL LOW (ref 6–20)
CO2: 25 mmol/L (ref 22–32)
Calcium: 7.8 mg/dL — ABNORMAL LOW (ref 8.9–10.3)
Chloride: 105 mmol/L (ref 98–111)
Creatinine, Ser: 0.62 mg/dL (ref 0.44–1.00)
GFR calc Af Amer: >60 mL/min (ref >60)
GFR calc non Af Amer: >60 mL/min (ref >60)
Glucose, Bld: 115 mg/dL — ABNORMAL HIGH (ref 70–99)
Potassium: 3.5 mmol/L (ref 3.5–5.1)
Sodium: 134 mmol/L — ABNORMAL LOW (ref 135–145)
Total Bilirubin: 0.6 mg/dL (ref 0.3–1.2)
Total Protein: 5.8 g/dL — ABNORMAL LOW (ref 6.5–8.1)

## 2017-08-22 LAB — CBC
HCT: 24.9 % — ABNORMAL LOW (ref 36.0–46.0)
Hemoglobin: 7.7 g/dL — ABNORMAL LOW (ref 12.0–15.0)
MCH: 24.1 pg — ABNORMAL LOW (ref 26.0–34.0)
MCHC: 30.9 g/dL (ref 30.0–36.0)
MCV: 78.1 fL (ref 78.0–100.0)
Platelets: 160 10*3/uL (ref 150–400)
RBC: 3.19 MIL/uL — ABNORMAL LOW (ref 3.87–5.11)
RDW: 16.5 % — ABNORMAL HIGH (ref 11.5–15.5)
WBC: 7.6 10*3/uL (ref 4.0–10.5)

## 2017-08-22 LAB — POCT I-STAT 4, (NA,K, GLUC, HGB,HCT)
Glucose, Bld: 116 mg/dL — ABNORMAL HIGH (ref 70–99)
HCT: 30 % — ABNORMAL LOW (ref 36.0–46.0)
HEMOGLOBIN: 10.2 g/dL — AB (ref 12.0–15.0)
POTASSIUM: 3.4 mmol/L — AB (ref 3.5–5.1)
Sodium: 137 mmol/L (ref 135–145)

## 2017-08-22 LAB — PROTIME-INR
INR: 1.18
Prothrombin Time: 14.9 seconds (ref 11.4–15.2)

## 2017-08-22 LAB — LACTIC ACID, PLASMA: Lactic Acid, Venous: 1 mmol/L (ref 0.5–1.9)

## 2017-08-22 LAB — APTT: aPTT: 28 seconds (ref 24–36)

## 2017-08-22 LAB — HIV ANTIBODY (ROUTINE TESTING W REFLEX): HIV Screen 4th Generation wRfx: NONREACTIVE

## 2017-08-22 SURGERY — OPEN REDUCTION INTERNAL FIXATION (ORIF) ACETABULAR FRACTURE
Anesthesia: General | Laterality: Right

## 2017-08-22 MED ORDER — CEFAZOLIN SODIUM-DEXTROSE 2-4 GM/100ML-% IV SOLN
2.0000 g | Freq: Three times a day (TID) | INTRAVENOUS | Status: AC
  Administered 2017-08-22 – 2017-08-23 (×3): 2 g via INTRAVENOUS
  Filled 2017-08-22 (×3): qty 100

## 2017-08-22 MED ORDER — MEPERIDINE HCL 50 MG/ML IJ SOLN: 6.2500 mg | INTRAMUSCULAR | Status: DC | PRN

## 2017-08-22 MED ORDER — LIDOCAINE 2% (20 MG/ML) 5 ML SYRINGE
INTRAMUSCULAR | Status: AC
  Filled 2017-08-22: qty 5

## 2017-08-22 MED ORDER — ROCURONIUM BROMIDE 10 MG/ML (PF) SYRINGE
PREFILLED_SYRINGE | INTRAVENOUS | Status: AC
  Filled 2017-08-22: qty 10

## 2017-08-22 MED ORDER — LORAZEPAM 2 MG/ML IJ SOLN: 0.0000 mg | Freq: Four times a day (QID) | INTRAMUSCULAR | Status: AC

## 2017-08-22 MED ORDER — OXYCODONE HCL 5 MG/5ML PO SOLN: 5.0000 mg | Freq: Once | ORAL | Status: DC | PRN

## 2017-08-22 MED ORDER — BACITRACIN ZINC 500 UNIT/GM EX OINT
TOPICAL_OINTMENT | CUTANEOUS | Status: AC
  Filled 2017-08-22: qty 28.35

## 2017-08-22 MED ORDER — FENTANYL CITRATE (PF) 250 MCG/5ML IJ SOLN
INTRAMUSCULAR | Status: AC
  Filled 2017-08-22: qty 5

## 2017-08-22 MED ORDER — VANCOMYCIN HCL 1000 MG IV SOLR
INTRAVENOUS | Status: DC | PRN
  Administered 2017-08-22: 1000 mg via TOPICAL

## 2017-08-22 MED ORDER — HYDROMORPHONE HCL 1 MG/ML IJ SOLN
0.2500 mg | INTRAMUSCULAR | Status: DC | PRN
  Administered 2017-08-22: 1 mg via INTRAVENOUS

## 2017-08-22 MED ORDER — THIAMINE HCL 100 MG/ML IJ SOLN
100.0000 mg | Freq: Every day | INTRAMUSCULAR | Status: DC
  Filled 2017-08-22: qty 2

## 2017-08-22 MED ORDER — MIDAZOLAM HCL 5 MG/5ML IJ SOLN
INTRAMUSCULAR | Status: DC | PRN
Start: 2017-08-22 — End: 2017-08-22
  Administered 2017-08-22: 2 mg via INTRAVENOUS

## 2017-08-22 MED ORDER — FENTANYL CITRATE (PF) 100 MCG/2ML IJ SOLN
INTRAMUSCULAR | Status: DC | PRN
  Administered 2017-08-22 (×4): 50 ug via INTRAVENOUS
  Administered 2017-08-22: 100 ug via INTRAVENOUS
  Administered 2017-08-22: 50 ug via INTRAVENOUS

## 2017-08-22 MED ORDER — FOLIC ACID 1 MG PO TABS
1.0000 mg | ORAL_TABLET | Freq: Every day | ORAL | Status: DC
  Administered 2017-08-23 – 2017-08-25 (×3): 1 mg via ORAL
  Filled 2017-08-22 (×3): qty 1

## 2017-08-22 MED ORDER — TOBRAMYCIN SULFATE 1.2 G IJ SOLR
INTRAMUSCULAR | Status: AC
  Filled 2017-08-22: qty 1.2

## 2017-08-22 MED ORDER — ROCURONIUM BROMIDE 100 MG/10ML IV SOLN
INTRAVENOUS | Status: DC | PRN
Start: 2017-08-22 — End: 2017-08-22
  Administered 2017-08-22: 50 mg via INTRAVENOUS
  Administered 2017-08-22: 30 mg via INTRAVENOUS
  Administered 2017-08-22: 20 mg via INTRAVENOUS

## 2017-08-22 MED ORDER — OXYCODONE HCL 5 MG PO TABS: 5.0000 mg | ORAL_TABLET | Freq: Once | ORAL | Status: DC | PRN

## 2017-08-22 MED ORDER — VITAMIN B-1 100 MG PO TABS
100.0000 mg | ORAL_TABLET | Freq: Every day | ORAL | Status: DC
  Administered 2017-08-23 – 2017-08-25 (×3): 100 mg via ORAL
  Filled 2017-08-22 (×3): qty 1

## 2017-08-22 MED ORDER — HYDROMORPHONE HCL 1 MG/ML IJ SOLN
INTRAMUSCULAR | Status: AC
  Filled 2017-08-22: qty 1

## 2017-08-22 MED ORDER — ONDANSETRON HCL 4 MG/2ML IJ SOLN
INTRAMUSCULAR | Status: DC | PRN
  Administered 2017-08-22: 4 mg via INTRAVENOUS

## 2017-08-22 MED ORDER — ONDANSETRON HCL 4 MG/2ML IJ SOLN
INTRAMUSCULAR | Status: AC
  Filled 2017-08-22: qty 2

## 2017-08-22 MED ORDER — 0.9 % SODIUM CHLORIDE (POUR BTL) OPTIME
TOPICAL | Status: DC | PRN
  Administered 2017-08-22: 1000 mL

## 2017-08-22 MED ORDER — PROMETHAZINE HCL 25 MG/ML IJ SOLN: 6.2500 mg | INTRAMUSCULAR | Status: DC | PRN

## 2017-08-22 MED ORDER — OXYCODONE HCL 5 MG PO TABS
ORAL_TABLET | ORAL | Status: AC
  Filled 2017-08-22: qty 2

## 2017-08-22 MED ORDER — ADULT MULTIVITAMIN W/MINERALS CH
1.0000 | ORAL_TABLET | Freq: Every day | ORAL | Status: DC
  Administered 2017-08-23 – 2017-08-25 (×3): 1 via ORAL
  Filled 2017-08-22 (×3): qty 1

## 2017-08-22 MED ORDER — MIDAZOLAM HCL 2 MG/2ML IJ SOLN
INTRAMUSCULAR | Status: AC
Start: 2017-08-22 — End: ?
  Filled 2017-08-22: qty 2

## 2017-08-22 MED ORDER — LORAZEPAM 2 MG/ML IJ SOLN: 0.0000 mg | Freq: Two times a day (BID) | INTRAMUSCULAR | Status: DC

## 2017-08-22 MED ORDER — LORAZEPAM 1 MG PO TABS
1.0000 mg | ORAL_TABLET | Freq: Four times a day (QID) | ORAL | Status: AC | PRN
Start: 2017-08-22 — End: 2017-08-25

## 2017-08-22 MED ORDER — VANCOMYCIN HCL 1000 MG IV SOLR
INTRAVENOUS | Status: AC
  Filled 2017-08-22: qty 1000

## 2017-08-22 MED ORDER — TRANEXAMIC ACID 1000 MG/10ML IV SOLN
1000.0000 mg | INTRAVENOUS | Status: AC
  Administered 2017-08-22: 1000 mg via INTRAVENOUS
  Filled 2017-08-22: qty 1100

## 2017-08-22 MED ORDER — SUGAMMADEX SODIUM 200 MG/2ML IV SOLN
INTRAVENOUS | Status: AC
  Filled 2017-08-22: qty 2

## 2017-08-22 MED ORDER — LACTATED RINGERS IV SOLN
INTRAVENOUS | Status: DC | PRN
  Administered 2017-08-22: 11:00:00 via INTRAVENOUS

## 2017-08-22 MED ORDER — DEXAMETHASONE SODIUM PHOSPHATE 10 MG/ML IJ SOLN
INTRAMUSCULAR | Status: DC | PRN
  Administered 2017-08-22: 5 mg via INTRAVENOUS

## 2017-08-22 MED ORDER — SUGAMMADEX SODIUM 200 MG/2ML IV SOLN
INTRAVENOUS | Status: DC | PRN
  Administered 2017-08-22: 200 mg via INTRAVENOUS

## 2017-08-22 MED ORDER — TOBRAMYCIN SULFATE 1.2 G IJ SOLR
INTRAMUSCULAR | Status: DC | PRN
  Administered 2017-08-22: 1.2 g via TOPICAL

## 2017-08-22 MED ORDER — DIPHENHYDRAMINE HCL 50 MG/ML IJ SOLN
INTRAMUSCULAR | Status: DC | PRN
  Administered 2017-08-22: 12.5 mg via INTRAVENOUS

## 2017-08-22 MED ORDER — LORAZEPAM 2 MG/ML IJ SOLN: 1.0000 mg | Freq: Four times a day (QID) | INTRAMUSCULAR | Status: AC | PRN

## 2017-08-22 MED ORDER — PROPOFOL 10 MG/ML IV BOLUS
INTRAVENOUS | Status: DC | PRN
  Administered 2017-08-22: 150 mg via INTRAVENOUS

## 2017-08-22 MED ORDER — LIDOCAINE HCL (CARDIAC) PF 100 MG/5ML IV SOSY
PREFILLED_SYRINGE | INTRAVENOUS | Status: DC | PRN
  Administered 2017-08-22: 60 mg via INTRAVENOUS

## 2017-08-22 MED ORDER — LACTATED RINGERS IV SOLN
INTRAVENOUS | Status: DC | PRN
  Administered 2017-08-22: 10:00:00 via INTRAVENOUS

## 2017-08-22 MED ORDER — KETOROLAC TROMETHAMINE 15 MG/ML IJ SOLN
15.0000 mg | Freq: Four times a day (QID) | INTRAMUSCULAR | Status: AC
  Administered 2017-08-22 – 2017-08-23 (×4): 15 mg via INTRAVENOUS
  Filled 2017-08-22 (×6): qty 1

## 2017-08-22 SURGICAL SUPPLY — 82 items
BIT DRILL 2.5 X LONG (BIT) ×1
BIT DRILL 2.5X300 (BIT) IMPLANT
BIT DRILL CANN 4.5MM (BIT) IMPLANT
BIT DRILL QC 3.5X195 (BIT) ×2 IMPLANT
BIT DRILL STEP 3.5 (DRILL) IMPLANT
BIT DRILL X LONG 2.5 (BIT) IMPLANT
BLADE CLIPPER SURG (BLADE) IMPLANT
BRUSH SCRUB SURG 4.25 DISP (MISCELLANEOUS) ×6 IMPLANT
CHLORAPREP W/TINT 26ML (MISCELLANEOUS) ×3 IMPLANT
COVER SURGICAL LIGHT HANDLE (MISCELLANEOUS) ×3 IMPLANT
DRAPE C-ARM 42X72 X-RAY (DRAPES) ×3 IMPLANT
DRAPE C-ARMOR (DRAPES) ×3 IMPLANT
DRAPE INCISE IOBAN 66X45 STRL (DRAPES) ×3 IMPLANT
DRAPE INCISE IOBAN 85X60 (DRAPES) ×3 IMPLANT
DRAPE U-SHAPE 47X51 STRL (DRAPES) ×3 IMPLANT
DRILL BIT 2.5X300 (BIT) ×3
DRILL BIT CANN 4.5MM (BIT) ×4
DRILL BIT X LONG 2.5 (BIT) ×3
DRILL STEP 3.5 (DRILL)
DRSG ADAPTIC 3X8 NADH LF (GAUZE/BANDAGES/DRESSINGS) ×2 IMPLANT
DRSG MEPILEX BORDER 4X12 (GAUZE/BANDAGES/DRESSINGS) IMPLANT
DRSG MEPILEX BORDER 4X8 (GAUZE/BANDAGES/DRESSINGS) IMPLANT
ELECT BLADE 6.5 EXT (BLADE) ×3 IMPLANT
ELECT REM PT RETURN 9FT ADLT (ELECTROSURGICAL) ×3
ELECTRODE REM PT RTRN 9FT ADLT (ELECTROSURGICAL) ×1 IMPLANT
GAUZE SPONGE 4X4 12PLY STRL (GAUZE/BANDAGES/DRESSINGS) ×2 IMPLANT
GLOVE BIO SURGEON STRL SZ7.5 (GLOVE) ×12 IMPLANT
GLOVE BIOGEL PI IND STRL 7.5 (GLOVE) ×1 IMPLANT
GLOVE BIOGEL PI INDICATOR 7.5 (GLOVE) ×2
GOWN STRL REUS W/ TWL LRG LVL3 (GOWN DISPOSABLE) ×2 IMPLANT
GOWN STRL REUS W/TWL LRG LVL3 (GOWN DISPOSABLE) ×6
GUIDEWIRE 2.0MM (WIRE) ×4 IMPLANT
GUIDEWIRE THREADED 2.8MM (WIRE) ×4 IMPLANT
HANDPIECE INTERPULSE COAX TIP (DISPOSABLE) ×3
KIT BASIN OR (CUSTOM PROCEDURE TRAY) ×3 IMPLANT
KIT TURNOVER KIT B (KITS) ×3 IMPLANT
MANIFOLD NEPTUNE II (INSTRUMENTS) ×3 IMPLANT
NS IRRIG 1000ML POUR BTL (IV SOLUTION) ×3 IMPLANT
PACK TOTAL JOINT (CUSTOM PROCEDURE TRAY) ×3 IMPLANT
PAD ABD 8X10 STRL (GAUZE/BANDAGES/DRESSINGS) ×2 IMPLANT
PAD ARMBOARD 7.5X6 YLW CONV (MISCELLANEOUS) ×6 IMPLANT
PILLOW ABDUCTION HIP (SOFTGOODS) ×2 IMPLANT
PLATE BONE 78MM 6HOLE PELVIC (Plate) ×2 IMPLANT
PLATE BONE LOCK 65MM 5 HOLE (Plate) ×2 IMPLANT
SCREW BONE CANN 7.3X70 (Screw) ×2 IMPLANT
SCREW CANN 6.5X90 (Screw) ×3 IMPLANT
SCREW CANN 6.5X90 32MM THD (Screw) IMPLANT
SCREW CORTEX 3.5 16MM (Screw) ×2 IMPLANT
SCREW CORTEX 3.5 20MM (Screw) ×2 IMPLANT
SCREW CORTEX 3.5 24MM (Screw) ×2 IMPLANT
SCREW CORTEX 3.5 32MM (Screw) ×2 IMPLANT
SCREW CORTEX 3.5 34MM (Screw) ×4 IMPLANT
SCREW CORTEX 3.5X45MM (Screw) ×2 IMPLANT
SCREW CORTEX 3.5X50MM (Screw) ×2 IMPLANT
SCREW LOCK CORT ST 3.5X16 (Screw) IMPLANT
SCREW LOCK CORT ST 3.5X20 (Screw) IMPLANT
SCREW LOCK CORT ST 3.5X24 (Screw) IMPLANT
SCREW LOCK CORT ST 3.5X32 (Screw) IMPLANT
SCREW LOCK CORT ST 3.5X34 (Screw) IMPLANT
SCREW PELVIC CORT ST 3.5X50 (Screw) IMPLANT
SCREW SCHNZ BLNT TRCR PT 5X200 (Screw) IMPLANT
SCRW SCHANZ BLNT TRCR PT 5X200 (Screw) ×6 IMPLANT
SET HNDPC FAN SPRY TIP SCT (DISPOSABLE) ×1 IMPLANT
SPONGE LAP 18X18 X RAY DECT (DISPOSABLE) IMPLANT
STAPLER VISISTAT 35W (STAPLE) ×3 IMPLANT
SUCTION FRAZIER HANDLE 10FR (MISCELLANEOUS) ×2
SUCTION TUBE FRAZIER 10FR DISP (MISCELLANEOUS) ×1 IMPLANT
SUT ETHILON 2 0 PSLX (SUTURE) ×6 IMPLANT
SUT FIBERWIRE #2 38 T-5 BLUE (SUTURE)
SUT MNCRL AB 3-0 PS2 18 (SUTURE) ×1 IMPLANT
SUT MON AB 2-0 CT1 36 (SUTURE) ×1 IMPLANT
SUT VIC AB 0 CT1 27 (SUTURE)
SUT VIC AB 0 CT1 27XBRD ANBCTR (SUTURE) ×1 IMPLANT
SUT VIC AB 1 CT1 18XCR BRD 8 (SUTURE) ×1 IMPLANT
SUT VIC AB 1 CT1 27 (SUTURE) ×6
SUT VIC AB 1 CT1 27XBRD ANBCTR (SUTURE) ×1 IMPLANT
SUT VIC AB 1 CT1 8-18 (SUTURE) ×3
SUT VIC AB 2-0 CT1 27 (SUTURE)
SUT VIC AB 2-0 CT1 TAPERPNT 27 (SUTURE) ×1 IMPLANT
SUTURE FIBERWR #2 38 T-5 BLUE (SUTURE) ×2 IMPLANT
TOWEL OR 17X26 10 PK STRL BLUE (TOWEL DISPOSABLE) ×6 IMPLANT
WASHER FOR 5.0 SCREWS (Washer) ×2 IMPLANT

## 2017-08-22 NOTE — Anesthesia Procedure Notes (Signed)
Procedure Name: Intubation Date/Time: 08/22/2017 10:24 AM Performed by: Lowella CurbMiller, Warren Ray, MD Pre-anesthesia Checklist: Patient identified, Emergency Drugs available, Suction available and Patient being monitored Patient Re-evaluated:Patient Re-evaluated prior to induction Oxygen Delivery Method: Circle System Utilized Preoxygenation: Pre-oxygenation with 100% oxygen Induction Type: IV induction Ventilation: Mask ventilation without difficulty Laryngoscope Size: Miller and 2 Grade View: Grade I Tube type: Oral Tube size: 7.0 mm Number of attempts: 1 Airway Equipment and Method: Stylet and Oral airway Placement Confirmation: ETT inserted through vocal cords under direct vision,  positive ETCO2 and breath sounds checked- equal and bilateral Secured at: 21 cm Tube secured with: Tape Dental Injury: Teeth and Oropharynx as per pre-operative assessment
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
## 2017-08-22 NOTE — Transfer of Care (Signed)
Immediate Anesthesia Transfer of Care Note  Patient: Megan Mosley  Procedure(s) Performed: OPEN REDUCTION INTERNAL FIXATION (ORIF) ACETABULAR FRACTURE (Right )  Patient Location: PACU  Anesthesia Type:General  Level of Consciousness: drowsy  Airway & Oxygen Therapy: Patient Spontanous Breathing and Patient connected to nasal cannula oxygen  Post-op Assessment: Report given to RN and Post -op Vital signs reviewed and stable  Post vital signs: Reviewed and stable  Last Vitals:  Vitals Value Taken Time  BP 124/90 08/22/2017  3:44 PM  Temp    Pulse 94 08/22/2017  3:50 PM  Resp 23 08/22/2017  3:50 PM  SpO2 100 % 08/22/2017  3:50 PM  Vitals shown include unvalidated device data.  Last Pain:  Vitals:   08/22/17 0847  TempSrc: Oral  PainSc:          Complications: No apparent anesthesia complications
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
## 2017-08-22 NOTE — Progress Notes (Signed)
Pt returned to room from PACU. Alert, oriented x4, family at bedside, Denies nausea/pain at this time. Right hip dsg CDI with ice pack applied

## 2017-08-22 NOTE — Anesthesia Preprocedure Evaluation (Signed)
Anesthesia Evaluation  Patient identified by MRN, date of birth, ID band Patient awake    Reviewed: Allergy & Precautions, NPO status , Patient's Chart, lab work & pertinent test results  Airway Mallampati: II  TM Distance: >3 FB Neck ROM: Full    Dental no notable dental hx.    Pulmonary neg pulmonary ROS, former smoker,    Pulmonary exam normal breath sounds clear to auscultation       Cardiovascular negative cardio ROS Normal cardiovascular exam Rhythm:Regular Rate:Normal     Neuro/Psych negative neurological ROS  negative psych ROS   GI/Hepatic negative GI ROS, Neg liver ROS,   Endo/Other  negative endocrine ROS  Renal/GU negative Renal ROS  negative genitourinary   Musculoskeletal negative musculoskeletal ROS (+)   Abdominal   Peds negative pediatric ROS (+)  Hematology negative hematology ROS (+) anemia ,   Anesthesia Other Findings   Reproductive/Obstetrics negative OB ROS                             Anesthesia Physical Anesthesia Plan  ASA: II  Anesthesia Plan: General   Post-op Pain Management:    Induction: Intravenous  PONV Risk Score and Plan: 3 and Ondansetron, Dexamethasone and Midazolam  Airway Management Planned: Oral ETT  Additional Equipment:   Intra-op Plan:   Post-operative Plan: Extubation in OR  Informed Consent: I have reviewed the patients History and Physical, chart, labs and discussed the procedure including the risks, benefits and alternatives for the proposed anesthesia with the patient or authorized representative who has indicated his/her understanding and acceptance.   Dental advisory given  Plan Discussed with: CRNA  Anesthesia Plan Comments:         Anesthesia Quick Evaluation
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
## 2017-08-23 ENCOUNTER — Encounter (HOSPITAL_COMMUNITY): Payer: Self-pay | Admitting: Orthopedic Surgery

## 2017-08-23 DIAGNOSIS — F10929 Alcohol use, unspecified with intoxication, unspecified: Secondary | ICD-10-CM | POA: Diagnosis present

## 2017-08-23 DIAGNOSIS — S32401A Unspecified fracture of right acetabulum, initial encounter for closed fracture: Secondary | ICD-10-CM | POA: Diagnosis present

## 2017-08-23 HISTORY — DX: Unspecified fracture of right acetabulum, initial encounter for closed fracture: S32.401A

## 2017-08-23 LAB — BASIC METABOLIC PANEL
ANION GAP: 7 (ref 5–15)
CALCIUM: 8.1 mg/dL — AB (ref 8.9–10.3)
CO2: 26 mmol/L (ref 22–32)
Chloride: 105 mmol/L (ref 98–111)
Creatinine, Ser: 0.68 mg/dL (ref 0.44–1.00)
GFR calc Af Amer: >60 mL/min (ref >60)
GLUCOSE: 109 mg/dL — AB (ref 70–99)
Potassium: 3.4 mmol/L — ABNORMAL LOW (ref 3.5–5.1)
SODIUM: 138 mmol/L (ref 135–145)

## 2017-08-23 LAB — CBC
HCT: 26.7 % — ABNORMAL LOW (ref 36.0–46.0)
HEMATOCRIT: 24.9 % — AB (ref 36.0–46.0)
HEMOGLOBIN: 8 g/dL — AB (ref 12.0–15.0)
Hemoglobin: 8.5 g/dL — ABNORMAL LOW (ref 12.0–15.0)
MCH: 25.5 pg — ABNORMAL LOW (ref 26.0–34.0)
MCH: 26.1 pg (ref 26.0–34.0)
MCHC: 31.8 g/dL (ref 30.0–36.0)
MCHC: 32.1 g/dL (ref 30.0–36.0)
MCV: 80.2 fL (ref 78.0–100.0)
MCV: 81.1 fL (ref 78.0–100.0)
PLATELETS: 151 10*3/uL (ref 150–400)
Platelets: 151 10*3/uL (ref 150–400)
RBC: 3.33 MIL/uL — ABNORMAL LOW (ref 3.87–5.11)
RBC: 3.07 MIL/uL — ABNORMAL LOW (ref 3.87–5.11)
RDW: 16.3 % — AB (ref 11.5–15.5)
RDW: 16.5 % — AB (ref 11.5–15.5)
WBC: 9.4 10*3/uL (ref 4.0–10.5)
WBC: 8.7 10*3/uL (ref 4.0–10.5)
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
## 2017-08-24 ENCOUNTER — Ambulatory Visit
Admit: 2017-08-24 | Discharge: 2017-08-24 | Disposition: A | Discharge status: Home / Self Care | Payer: BLUE CROSS/BLUE SHIELD | Attending: Radiation Oncology | Admitting: Radiation Oncology

## 2017-08-24 ENCOUNTER — Encounter: Payer: Self-pay | Admitting: Radiation Oncology

## 2017-08-24 ENCOUNTER — Encounter (HOSPITAL_COMMUNITY): Payer: Self-pay | Admitting: Student

## 2017-08-24 ENCOUNTER — Ambulatory Visit
Admission: RE | Admit: 2017-08-24 | Discharge: 2017-08-24 | Disposition: A | Discharge status: Home / Self Care | Payer: BLUE CROSS/BLUE SHIELD | Source: Ambulatory Visit | Attending: Radiation Oncology | Admitting: Radiation Oncology

## 2017-08-24 DIAGNOSIS — M898X9 Other specified disorders of bone, unspecified site: Secondary | ICD-10-CM

## 2017-08-24 DIAGNOSIS — S32401A Unspecified fracture of right acetabulum, initial encounter for closed fracture: Secondary | ICD-10-CM

## 2017-08-24 LAB — CBC
HCT: 27.2 % — ABNORMAL LOW (ref 36.0–46.0)
Hemoglobin: 8.3 g/dL — ABNORMAL LOW (ref 12.0–15.0)
MCH: 25.2 pg — AB (ref 26.0–34.0)
MCHC: 30.5 g/dL (ref 30.0–36.0)
MCV: 82.7 fL (ref 78.0–100.0)
Platelets: 168 10*3/uL (ref 150–400)
RBC: 3.29 MIL/uL — ABNORMAL LOW (ref 3.87–5.11)
RDW: 16.8 % — AB (ref 11.5–15.5)
WBC: 7 10*3/uL (ref 4.0–10.5)

## 2017-08-24 LAB — URINALYSIS, ROUTINE W REFLEX MICROSCOPIC
Bilirubin Urine: NEGATIVE
GLUCOSE, UA: NEGATIVE mg/dL
Ketones, ur: NEGATIVE mg/dL
Leukocytes, UA: NEGATIVE
NITRITE: NEGATIVE
PH: 8 (ref 5.0–8.0)
PROTEIN: 30 mg/dL — AB
Specific Gravity, Urine: 1.008 (ref 1.005–1.030)

## 2017-08-24 MED ORDER — MAGNESIUM CITRATE PO SOLN
1.0000 | Freq: Once | ORAL | Status: AC
  Administered 2017-08-24: 1 via ORAL
  Filled 2017-08-24: qty 296

## 2017-08-24 MED ORDER — ENOXAPARIN SODIUM 40 MG/0.4ML ~~LOC~~ SOLN
40.0000 mg | SUBCUTANEOUS | Status: DC
  Administered 2017-08-24 – 2017-08-25 (×2): 40 mg via SUBCUTANEOUS
  Filled 2017-08-24 (×2): qty 0.4
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
## 2017-08-24 NOTE — Discharge Instructions (Signed)
Touch down weight baring only with right lower extremity
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
## 2017-08-24 NOTE — Progress Notes (Signed)
[facility] Therapy Treatment Record Phone [phone]   Radiation Therapy was administered to Megan Mosley on: 08/24/2017  8:52 AM and was treatment # 1 out of a planned course of 1 treatments.  Radiation Treatment  1). Beam photons with 6-10 energy  2). Brachytherapy None  3). Stereotactic Radiosurgery None  4). Other Radiation None     Debroah BallerJennifer Trysten Bernard, RT (T)
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
## 2017-08-25 LAB — CBC
HCT: 26.6 % — ABNORMAL LOW (ref 36.0–46.0)
Hemoglobin: 8.1 g/dL — ABNORMAL LOW (ref 12.0–15.0)
MCH: 25.2 pg — ABNORMAL LOW (ref 26.0–34.0)
MCHC: 30.5 g/dL (ref 30.0–36.0)
MCV: 82.6 fL (ref 78.0–100.0)
Platelets: 174 10*3/uL (ref 150–400)
RBC: 3.22 MIL/uL — ABNORMAL LOW (ref 3.87–5.11)
RDW: 16.9 % — ABNORMAL HIGH (ref 11.5–15.5)
WBC: 5.4 10*3/uL (ref 4.0–10.5)

## 2017-08-25 LAB — BASIC METABOLIC PANEL WITH GFR
Anion gap: 4 — ABNORMAL LOW (ref 5–15)
BUN: <5 mg/dL — ABNORMAL LOW (ref 6–20)
CO2: 28 mmol/L (ref 22–32)
Calcium: 8.5 mg/dL — ABNORMAL LOW (ref 8.9–10.3)
Chloride: 106 mmol/L (ref 98–111)
Creatinine, Ser: 0.67 mg/dL (ref 0.44–1.00)
GFR calc Af Amer: >60 mL/min
GFR calc non Af Amer: >60 mL/min
Glucose, Bld: 108 mg/dL — ABNORMAL HIGH (ref 70–99)
Potassium: 3.8 mmol/L (ref 3.5–5.1)
Sodium: 138 mmol/L (ref 135–145)

## 2017-08-25 LAB — BPAM RBC
BLOOD PRODUCT EXPIRATION DATE: 201907282359
Blood Product Expiration Date: 201907232359
Blood Product Expiration Date: 201908052359
Blood Product Expiration Date: 201908052359
ISSUE DATE / TIME: 201907030742
ISSUE DATE / TIME: 201907031025
UNIT TYPE AND RH: 5100
UNIT TYPE AND RH: 5100
Unit Type and Rh: 5100
Unit Type and Rh: 5100

## 2017-08-25 LAB — TYPE AND SCREEN
ABO/RH(D): O POS
ANTIBODY SCREEN: NEGATIVE
UNIT DIVISION: 0
UNIT DIVISION: 0
Unit division: 0
Unit division: 0

## 2017-08-25 MED ORDER — OXYCODONE HCL 5 MG PO TABS: 5.0000 mg | ORAL_TABLET | ORAL | 0 refills | Status: AC | PRN

## 2017-08-25 MED ORDER — METHOCARBAMOL 500 MG PO TABS: 500.0000 mg | ORAL_TABLET | Freq: Three times a day (TID) | ORAL | 0 refills | Status: AC | PRN

## 2017-08-25 MED ORDER — ENOXAPARIN SODIUM 40 MG/0.4ML ~~LOC~~ SOLN: 40.0000 mg | SUBCUTANEOUS | 0 refills | Status: AC

## 2017-08-25 MED ORDER — BACITRACIN ZINC 500 UNIT/GM EX OINT: TOPICAL_OINTMENT | Freq: Two times a day (BID) | CUTANEOUS | 0 refills | Status: AC
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
## 2017-08-25 NOTE — Progress Notes (Signed)
Orthopedic Tech Progress Note Patient Details:  Megan Mosley Oct 27, 1990 [id number]  Ortho Devices Type of Ortho Device: Crutches Ortho Device/Splint Interventions: Application   Post Interventions Patient Tolerated: Well Instructions Provided: Care of device Viewed order from doctor's order list  Megan DomCrawford, Megan Mosley 08/25/2017, 12:18 PM

## 2017-08-25 NOTE — Progress Notes (Signed)
Pt getting ready for DC, will call ortho tech for crutches and checked with CM on home care.  Cardiac monitoring DC, called [facility] and let them know.

## 2017-08-25 NOTE — Plan of Care (Signed)
  Problem: Education: Goal: Knowledge of General Education information will improve Outcome: Progressing   Problem: Clinical Measurements: Goal: Ability to maintain clinical measurements within normal limits will improve Outcome: Progressing Goal: Will remain free from infection Outcome: Progressing   Problem: Activity: Goal: Risk for activity intolerance will decrease Outcome: Progressing   Problem: Pain Managment: Goal: General experience of comfort will improve Outcome: Progressing   Problem: Safety: Goal: Ability to remain free from injury will improve Outcome: Progressing

## 2017-08-25 NOTE — Progress Notes (Signed)
Pt ready for DC to home.  Pt has crutches and has been instructed on their use from PT.  Pt has 3N1 for home use and has been instructed on how to use it.  DC instructions given and reviewed.  Pt given Antibiotic ointment and instructed how to clean her forehead and apply the ointment and use the foam dressings.  Pt and family shown how to do this.  Pt informed to call on Monday am to get Ortho appt and has the # to call.  Family is to be with pt 24 hours a day.  No further questions about home self care.
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
## 2017-08-25 NOTE — Progress Notes (Signed)
Orthopedic Tech Progress Note Patient Details:  Megan Mosley [birth year] [id number]  Patient ID: Megan Mosley, female   DOB: [birth year], 27 y.o.   MRN: [id number]   Nikki DomCrawford, [name] 08/25/2017, 12:18 PM Delete one ortho visit
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
## 2017-08-27 ENCOUNTER — Encounter: Payer: Self-pay | Admitting: Student

## 2017-08-27 ENCOUNTER — Encounter (HOSPITAL_COMMUNITY): Payer: Self-pay | Admitting: Emergency Medicine

## 2017-08-27 NOTE — Anesthesia Postprocedure Evaluation (Addendum)
Anesthesia Post Note  Patient: Megan Mosley  Procedure(s) Performed: OPEN REDUCTION INTERNAL FIXATION (ORIF) ACETABULAR FRACTURE (Right )     Patient location during evaluation: PACU Anesthesia Type: General Level of consciousness: sedated and patient cooperative Pain management: pain level controlled Vital Signs Assessment: post-procedure vital signs reviewed and stable Respiratory status: spontaneous breathing Cardiovascular status: stable Anesthetic complications: no    Last Vitals:  Vitals:   08/24/17 2140 08/25/17 0458  BP: 110/69 115/77  Pulse: 93 (!) 101  Resp:    Temp: 37 C 36.8 C  SpO2: 100% 100%    Last Pain:  Vitals:   08/25/17 1024  TempSrc:   PainSc: 3                  Lewie LoronJohn Samarion Ehle

## 2017-08-29 NOTE — Progress Notes (Signed)
  Radiation Oncology         [phone] ________________________________  Name: Megan Mosley MRN: [id number]  Date: 08/24/2017  DOB: 01-05-1991  End of Treatment Note  Diagnosis:   Right acetabular fracture with risk of postoperative heterotopic ossification     Indication for treatment:  curative       Radiation treatment dates:   08/24/2017  Site/dose:   The patient was treated to a dose of 7 Gy to the right hip. This was accomplished with AP and PA fields.  Narrative: The patient tolerated radiation treatment relatively well.   No difficulties.  Plan: The patient has completed radiation treatment. The patient will return to radiation oncology clinic on an as needed basis. ________________________________  Megan GunningJohn S. Garrin Kirwan, MD, PhD  This document serves as a record of services personally performed by Megan PufferJohn Derrius Furtick, MD. It was created on his behalf by Ivar BuryHaley Woodruff, a trained medical scribe. The creation of this record is based on the scribe's personal observations and the provider's statements to them. This document has been checked and approved by the attending provider.

## 2017-09-12 DIAGNOSIS — M898X9 Other specified disorders of bone, unspecified site: Secondary | ICD-10-CM | POA: Insufficient documentation

## 2017-09-12 NOTE — Progress Notes (Signed)
  Radiation Oncology         [phone] ________________________________  Name: Megan Mosley MRN: [id number]  Date: 08/24/2017  DOB: 1990/02/23  SIMULATION AND TREATMENT PLANNING NOTE  DIAGNOSIS:     ICD-10-CM   1. 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  Identity was confirmed.  All relevant records and images related to the planned course of therapy were reviewed.   Written consent to proceed with treatment was confirmed which was freely given after reviewing the details related to the planned course of therapy had been reviewed with the patient.  Then, the patient was set-up in a stable reproducible supine position for radiation therapy.  Xrays were obtained of the target hip area.     The images were reviewed and 2 treatment fields were designed and positioned to treat the appropriate target region. A simple isodose plan is requested.  PLAN:  The patient will receive 7 Gy in 1 fraction.    Simulation verification Port films were taken prior to treatment. Each of the treatment fields was reviewed and appropriately delineates the target regions and the patient was appropriate to proceed with radiation treatment.  ________________________________   Radene GunningJohn S. Jayel Scaduto, MD, PhD
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
## 2018-04-29 ENCOUNTER — Other Ambulatory Visit: Payer: Self-pay | Admitting: Obstetrics & Gynecology

## 2018-04-29 DIAGNOSIS — N979 Female infertility, unspecified: Secondary | ICD-10-CM

## 2018-09-19 ENCOUNTER — Ambulatory Visit
Admission: RE | Admit: 2018-09-19 | Discharge: 2018-09-19 | Disposition: A | Discharge status: Home / Self Care | Payer: 59 | Source: Ambulatory Visit | Attending: Obstetrics & Gynecology | Admitting: Obstetrics & Gynecology

## 2018-09-19 DIAGNOSIS — N979 Female infertility, unspecified: Secondary | ICD-10-CM

## 2019-05-13 IMAGING — CT CT PELVIS W/O CM
2 of 3 series · 15 of 46 positions shown, 17 images · non-contrast
Comparison: CT scan 08/21/2017

CLINICAL DATA: Postop right hip surgery for complex acetabular
fracture.

EXAM:
CT PELVIS WITHOUT CONTRAST
TECHNIQUE: Multidetector CT imaging of the pelvis was performed following the
standard protocol without intravenous contrast.

[Series 3: pelvis 2.0 st · axial · 0.89mm/px · z∈[+1063,+1297]mm · 12 of 135 slices shown, 14 images]
[im 9/135  soft-tissue]
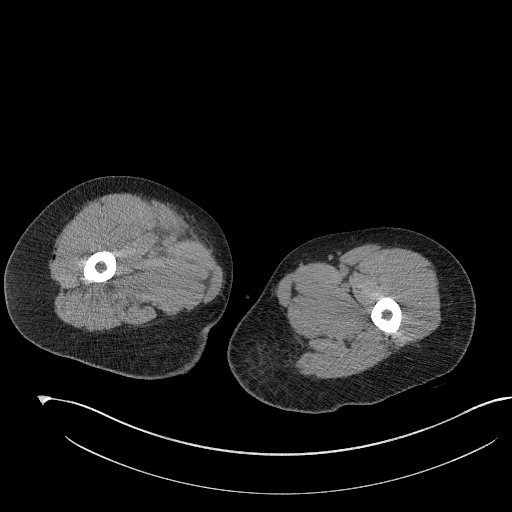
[im 9/135  bone]
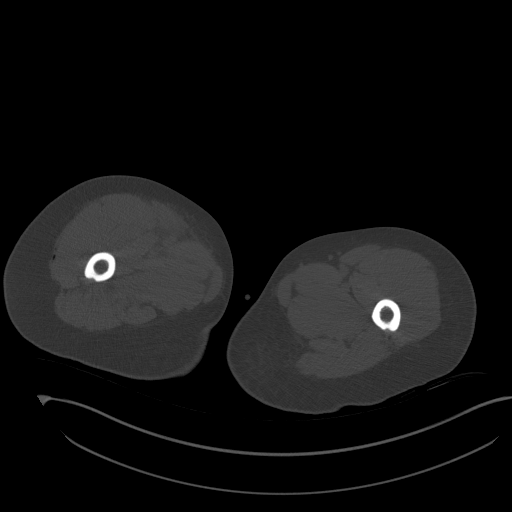
[im 18/135  soft-tissue]
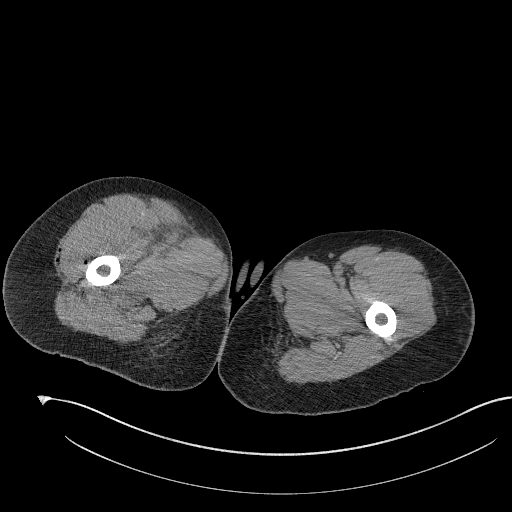
[im 31/135  soft-tissue]
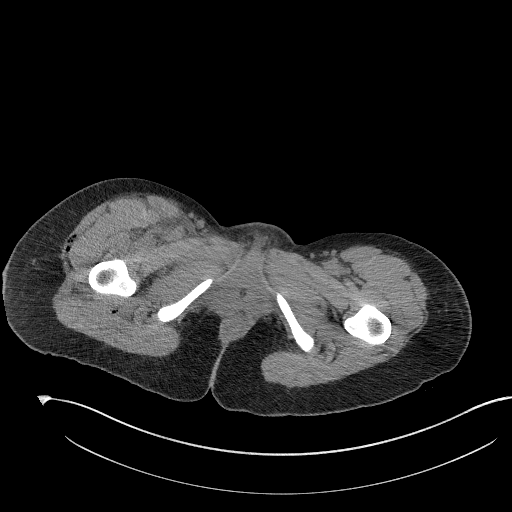
[im 39/135  soft-tissue]
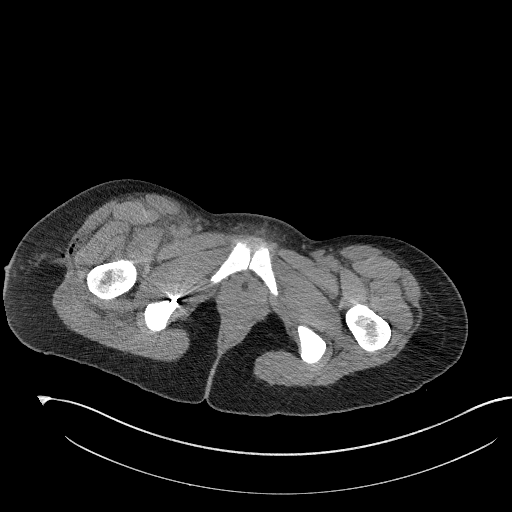
[im 52/135  soft-tissue]
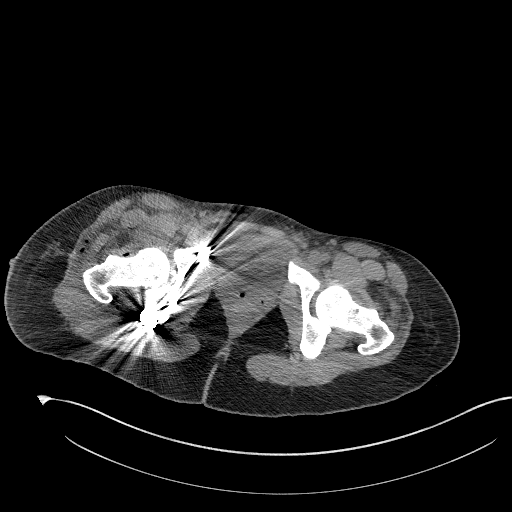
[im 61/135  soft-tissue]
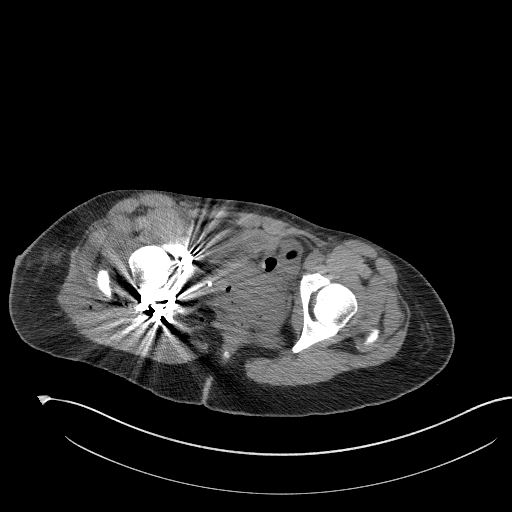
[im 74/135  soft-tissue]
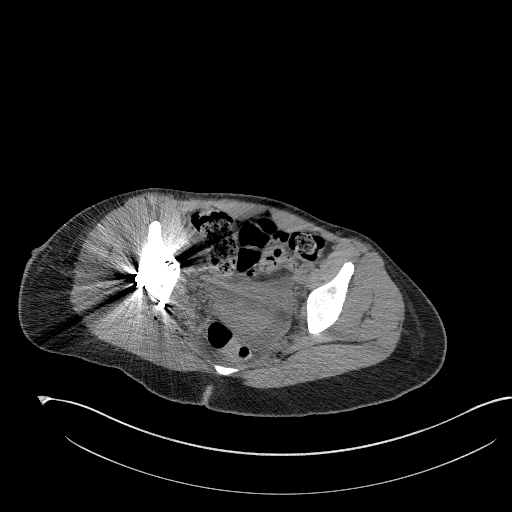
[im 83/135  soft-tissue]
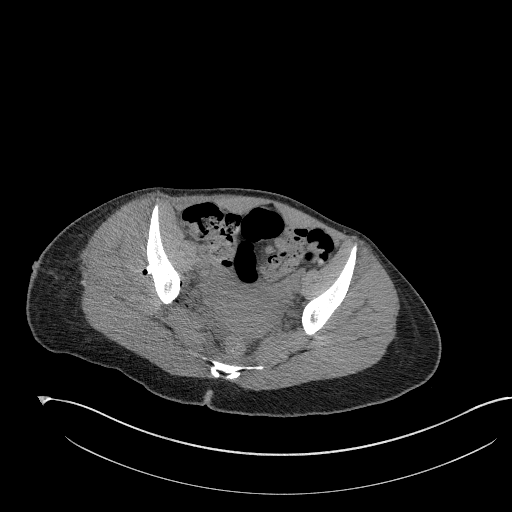
[im 96/135  soft-tissue]
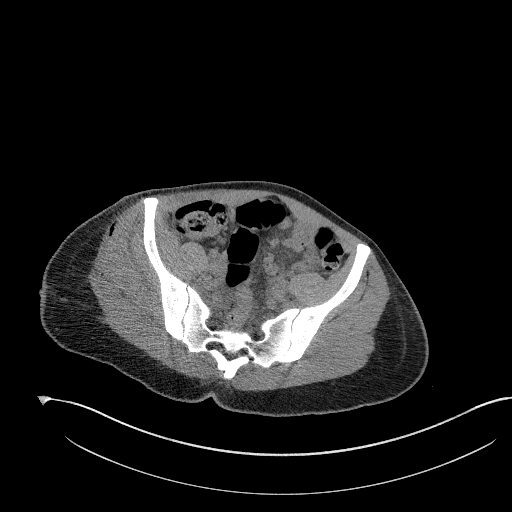
[im 96/135  bone]
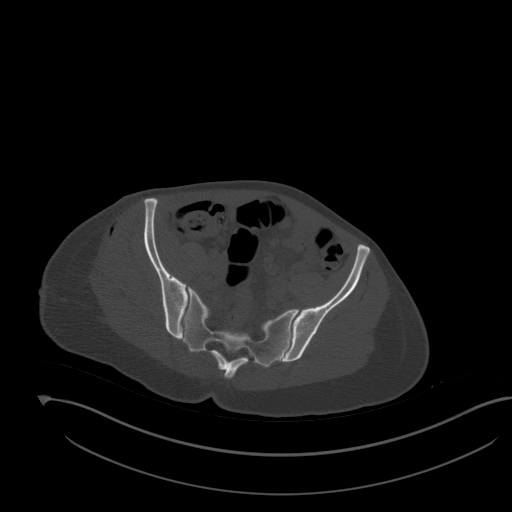
[im 104/135  soft-tissue]
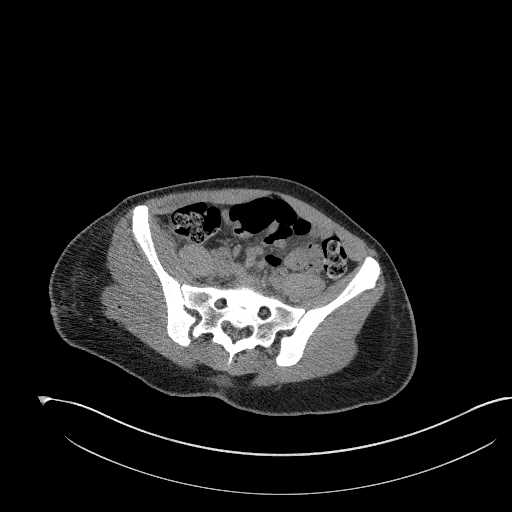
[im 117/135  soft-tissue]
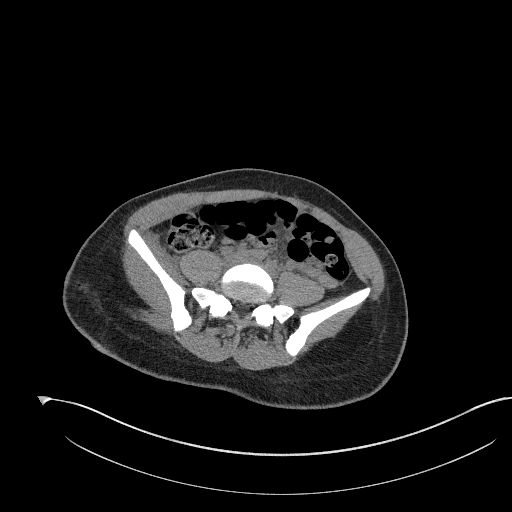
[im 126/135  soft-tissue]
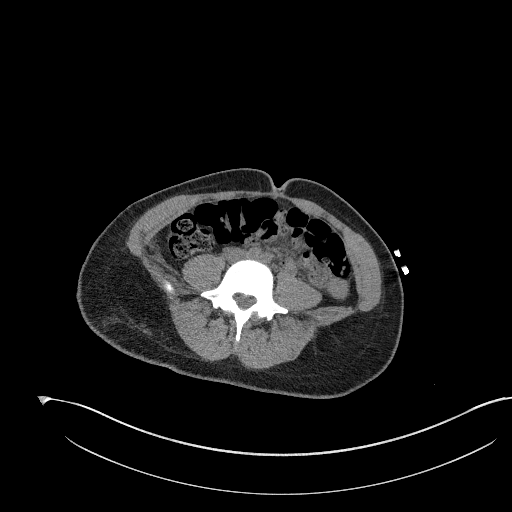

[Series 9: coronal st · coronal · 0.53mm/px · 3 of 118 slices shown]
[im 40/118  soft-tissue]
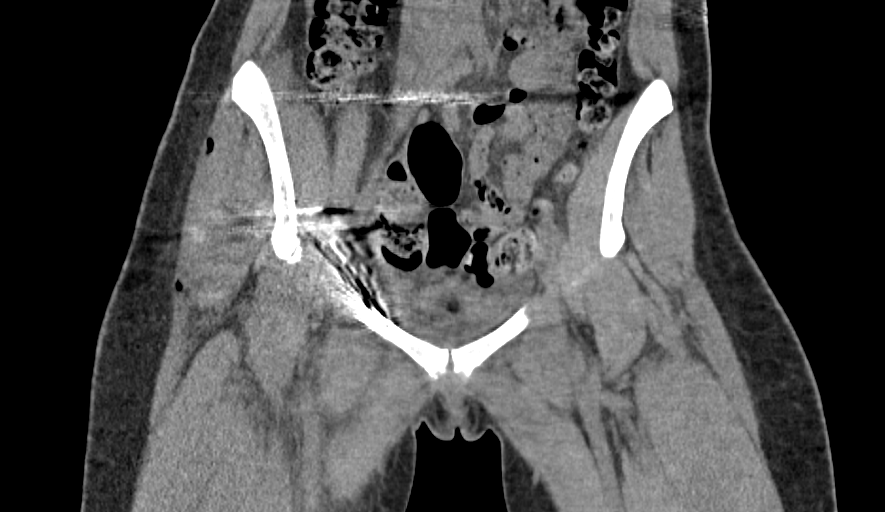
[im 53/118  soft-tissue]
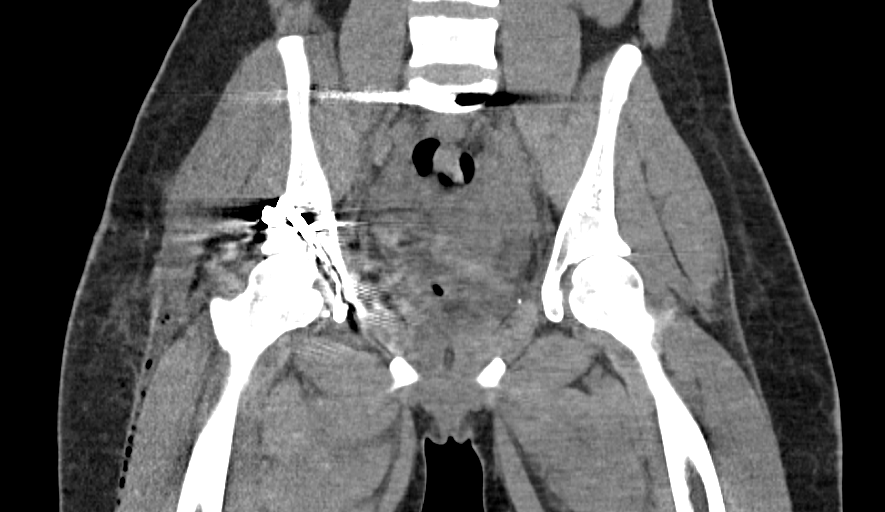
[im 66/118  soft-tissue]
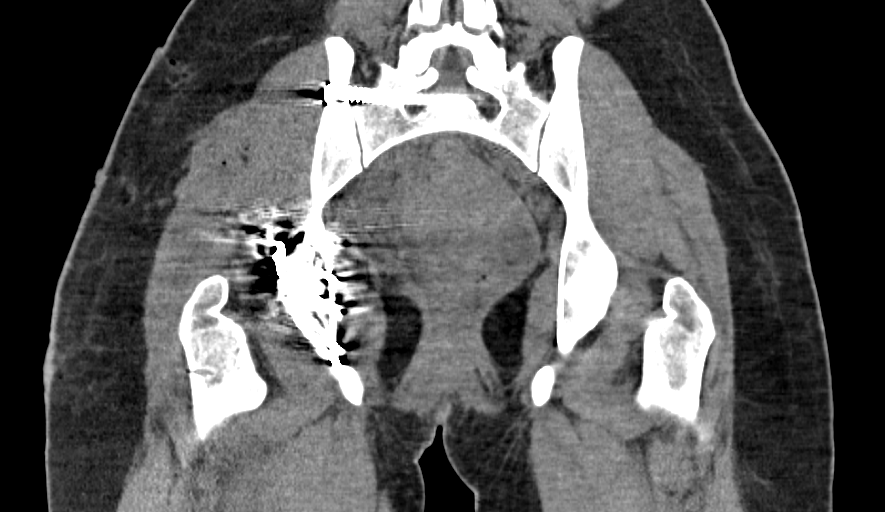

[15 of 46 positions shown; findings below may reference images not displayed]

FINDINGS: Extensive fixation hardware involving the right acetabular with
excellent reduction of the complex/comminuted and displaced
acetabular fractures.

There is a single cannulated screw crossing the right SI joint which
has been reduced anatomically. The left SI joint is normal.

Both hips are normally located. No hip fracture or AVN. There is a
very small, 4.5 mm, bone fragment in the right hip joint. Small
amount of residual air and fluid in the joint also.

The pubic symphysis appears normal.  No pubic bone fractures.

No significant intrapelvic abnormalities. Residual free pelvic
fluid/hematoma is noted.
IMPRESSION: 1. Anatomic reduction of the complex comminuted displaced acetabular
fractures.
2. Reduction of the widened right SI joint with a single cannulated
screw.
3. 4.5 mm bone fragment noted in the right hip joint.

## 2023-05-06 ENCOUNTER — Encounter (HOSPITAL_COMMUNITY): Payer: Self-pay | Admitting: Emergency Medicine

## 2023-05-06 ENCOUNTER — Emergency Department (HOSPITAL_COMMUNITY)

## 2023-05-06 ENCOUNTER — Emergency Department (HOSPITAL_COMMUNITY)
Admission: EM | Admit: 2023-05-06 | Discharge: 2023-05-06 | Disposition: A | Discharge status: Home / Self Care | Attending: Emergency Medicine | Admitting: Emergency Medicine

## 2023-05-06 DIAGNOSIS — Y9383 Activity, rough housing and horseplay: Secondary | ICD-10-CM | POA: Diagnosis not present

## 2023-05-06 DIAGNOSIS — X58XXXA Exposure to other specified factors, initial encounter: Secondary | ICD-10-CM | POA: Insufficient documentation

## 2023-05-06 DIAGNOSIS — S6992XA Unspecified injury of left wrist, hand and finger(s), initial encounter: Secondary | ICD-10-CM | POA: Diagnosis not present

## 2023-05-06 DIAGNOSIS — M79645 Pain in left finger(s): Secondary | ICD-10-CM | POA: Diagnosis present

## 2023-05-06 NOTE — ED Triage Notes (Signed)
 Pt here from home with c/o left thumb pain after playing around with her nephew , pain and swelling noted to the lower joint of her left thumb

## 2023-05-06 NOTE — ED Notes (Signed)
 Pt alert, NAD, calm, interactive, resps e/u. EDP at [facility]. Pt seen by EDP prior to RN assessment. Verbalized orders to apply velcro splint and will d/c.

## 2023-05-06 NOTE — ED Provider Notes (Signed)
 [place] EMERGENCY DEPARTMENT AT [facility] Provider Note   CSN: [id number] Arrival date & time: 05/06/23  1150     History  Chief Complaint  Patient presents with   Hand Injury    KALILA ADKISON is a 33 y.o. female.  33 year old female left-handed who presents emergency department left thumb pain.  Patient reports that she was roughhousing with her 3-year-old nephew on Friday when she felt a pop in her left thumb and had swelling shortly afterwards.  Has been icing it.  Not trying any pain medication yet.  Works at [employer].  No surgeries on that hand.       Home Medications Prior to Admission medications   Medication Sig Start Date End Date Taking? Authorizing Provider  bacitracin ointment Apply topically 2 (two) times daily. 08/25/17   Kinsinger, De Blanch, MD  enoxaparin (LOVENOX) 40 MG/0.4ML injection Inject 0.4 mLs (40 mg total) into the skin daily. 08/25/17   Kinsinger, De Blanch, MD  ibuprofen (ADVIL,MOTRIN) 200 MG tablet Take 200 mg by mouth every 6 (six) hours as needed for moderate pain.    [provider]  methocarbamol (ROBAXIN) 500 MG tablet Take 1 tablet (500 mg total) by mouth every 8 (eight) hours as needed for up to 30 doses for muscle spasms. 08/25/17   Kinsinger, De Blanch, MD  oxyCODONE (OXY IR/ROXICODONE) 5 MG immediate release tablet Take 1 tablet (5 mg total) by mouth every 4 (four) hours as needed for moderate pain. 08/25/17   Kinsinger, De Blanch, MD      Allergies    Patient has no known allergies.    Review of Systems   Review of Systems  Physical Exam Updated Vital Signs BP 114/63 (BP Location: Right Arm)   Pulse 61   Temp 98.4 F (36.9 C)   Resp 15   SpO2 99%  Physical Exam Musculoskeletal:     Comments: Mild amount of swelling at the left thumb PIP.  Tenderness to palpation to the radial aspect of the PIP joint.  Full range of motion of the phalanges and MCP of the thumb.  No tenderness to palpation of the IP joint  or CMC or snuffbox.  No erythema or warmth.     ED Results / Procedures / Treatments   Labs (all labs ordered are listed, but only abnormal results are displayed) Labs Reviewed - No data to display  EKG None  Radiology DG Finger Thumb Left Result Date: 05/06/2023 CLINICAL DATA:  Finger pain and swelling EXAM: LEFT THUMB 3 v COMPARISON:  None Available. FINDINGS: There is no evidence of fracture or dislocation. There is no evidence of arthropathy or other focal bone abnormality. Soft tissues are unremarkable. IMPRESSION: Negative. Electronically Signed   By: Tiburcio Pea M.D.   On: 05/06/2023 12:45    Procedures Procedures    Medications Ordered in ED Medications - No data to display  ED Course/ Medical Decision Making/ A&P                                 Medical Decision Making Amount and/or Complexity of Data Reviewed Radiology: ordered.   MYCA PERNO is a 33 y.o. female with comorbidities that complicate the patient evaluation including left-handed who presents emergency department with left PIP pain after roughhousing with her nephew  Initial Ddx:  Fracture, dislocation, strain/sprain  MDM/Course:  Patient presents to the emergency department with PIP  injury of the left thumb.  No obvious deformities aside from some mild swelling.  X-ray without fracture.  Patient was instructed to wear a thumb spica splint for comfort and to follow-up with hand surgery if her symptoms do not improve.  Given a work note as well.   This patient presents to the ED for concern of complaints listed in HPI, this involves an extensive number of treatment options, and is a complaint that carries with it a high risk of complications and morbidity. Disposition including potential need for admission considered.   Dispo: DC Home. Return precautions discussed including, but not limited to, those listed in the AVS. Allowed pt time to ask questions which were answered fully prior to dc.  I  independently reviewed the following imaging with scope of interpretation limited to determining acute life threatening conditions related to emergency care: Extremity x-ray(s) and agree with the radiologist interpretation with the following exceptions: none I have reviewed the patients home medications and made adjustments as needed  Portions of this note were generated with Dragon dictation software. Dictation errors may occur despite best attempts at proofreading.     Final Clinical Impression(s) / ED Diagnoses Final diagnoses:  Injury of left thumb, initial encounter    Rx / DC Orders ED Discharge Orders     None         Rondel Baton, MD 05/06/23 1321

## 2023-05-06 NOTE — Discharge Instructions (Signed)
 You were seen for your thumb injury in the emergency department.   At home, please use Tylenol and ibuprofen for the pain.  Ice it to limit swelling.  Use the splint that we gave you for comfort.    If your symptoms do not improve in 1 to 2 weeks you may follow-up with the hand surgeons.  Return immediately to the emergency department if you experience any of the following: Worsening pain, or any other concerning symptoms.    Thank you for visiting our Emergency Department. It was a pleasure taking care of you today.

## 2023-09-07 ENCOUNTER — Encounter (HOSPITAL_COMMUNITY): Payer: Self-pay | Admitting: *Deleted

## 2023-09-07 ENCOUNTER — Ambulatory Visit (HOSPITAL_COMMUNITY): Admission: EM | Admit: 2023-09-07 | Discharge: 2023-09-07 | Disposition: A | Discharge status: Home / Self Care

## 2023-09-07 ENCOUNTER — Other Ambulatory Visit: Payer: Self-pay

## 2023-09-07 DIAGNOSIS — S61309A Unspecified open wound of unspecified finger with damage to nail, initial encounter: Secondary | ICD-10-CM

## 2023-09-07 MED ORDER — BACITRACIN ZINC 500 UNIT/GM EX OINT: TOPICAL_OINTMENT | Freq: Once | CUTANEOUS | Status: AC

## 2023-09-07 MED ORDER — BUPIVACAINE HCL (PF) 0.5 % IJ SOLN
INTRAMUSCULAR | Status: AC
  Filled 2023-09-07: qty 10
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
## 2023-09-07 NOTE — ED Triage Notes (Signed)
 PT presents with broken nail on RT small finger . Pt broke nail last night.

## 2023-09-07 NOTE — Discharge Instructions (Signed)
  1. Avulsion of fingernail, initial encounter (Primary) - Wound care (Clean Wound) with iodine and warm water for 15 minutes prior to nail avulsion removal procedure - bacitracin  ointment after procedure completed for infection prevention and proper healing - Apply dressing with nonadherent gauze, bacitracin , Coban for pressure over wound to control bleeding and protect finger nailbed. - Nail Removal of right fifth finger following partial nail avulsion that occurred last night. - Continue to monitor for any change in current symptoms if there is any increased redness, swelling, purulent drainage, increased pain, or fever follow-up for further evaluation and management.
